# Patient Record
Sex: Male | Born: 1955 | Race: Black or African American | Hispanic: No | Smoking: Current some day smoker
Health system: Southern US, Community
[De-identification: ages and names within clinical notes are randomized; demographics above are authoritative.]

## PROBLEM LIST (undated history)

## (undated) DIAGNOSIS — I10 Essential (primary) hypertension: Secondary | ICD-10-CM

## (undated) DIAGNOSIS — E785 Hyperlipidemia, unspecified: Secondary | ICD-10-CM

## (undated) DIAGNOSIS — M25561 Pain in right knee: Secondary | ICD-10-CM

## (undated) DIAGNOSIS — M109 Gout, unspecified: Secondary | ICD-10-CM

## (undated) DIAGNOSIS — G4730 Sleep apnea, unspecified: Secondary | ICD-10-CM

## (undated) DIAGNOSIS — R7303 Prediabetes: Secondary | ICD-10-CM

## (undated) HISTORY — PX: SKIN GRAFT: SHX250

---

## 2013-12-03 ENCOUNTER — Encounter (HOSPITAL_COMMUNITY): Payer: Self-pay | Admitting: Emergency Medicine

## 2013-12-03 ENCOUNTER — Emergency Department (INDEPENDENT_AMBULATORY_CARE_PROVIDER_SITE_OTHER)
Admission: EM | Admit: 2013-12-03 | Discharge: 2013-12-03 | Disposition: A | Discharge status: Home / Self Care | Payer: Worker's Compensation | Source: Home / Self Care | Attending: Family Medicine | Admitting: Family Medicine

## 2013-12-03 DIAGNOSIS — S39011A Strain of muscle, fascia and tendon of abdomen, initial encounter: Secondary | ICD-10-CM

## 2013-12-03 DIAGNOSIS — Y990 Civilian activity done for income or pay: Secondary | ICD-10-CM

## 2013-12-03 DIAGNOSIS — T1490XA Injury, unspecified, initial encounter: Secondary | ICD-10-CM

## 2013-12-03 DIAGNOSIS — IMO0002 Reserved for concepts with insufficient information to code with codable children: Secondary | ICD-10-CM

## 2013-12-03 DIAGNOSIS — S3991XA Unspecified injury of abdomen, initial encounter: Secondary | ICD-10-CM

## 2013-12-03 DIAGNOSIS — W19XXXA Unspecified fall, initial encounter: Secondary | ICD-10-CM

## 2013-12-03 HISTORY — DX: Gout, unspecified: M10.9

## 2013-12-03 HISTORY — DX: Essential (primary) hypertension: I10

## 2013-12-03 MED ORDER — TRAMADOL HCL 50 MG PO TABS: 50.0000 mg | ORAL_TABLET | Freq: Four times a day (QID) | ORAL | Status: DC | PRN

## 2013-12-03 NOTE — ED Provider Notes (Signed)
Jonathan [name] is a 58 y.o. male who presents to Urgent Care today for right groin injury. Patient slipped and fell on ice today at work. He has pain in his right groin and oblique muscle. Pain is worse with sitting up. Patient denies any radiating pain weakness or numbness. He has not tried any medications yet. No fevers or chills.   Past Medical History  Diagnosis Date  . Gout   . Hypertension    History  Substance Use Topics  . Smoking status: Never Smoker   . Smokeless tobacco: Not on file  . Alcohol Use: Yes   ROS as above Medications: No current facility-administered medications for this encounter.   Current Outpatient Prescriptions  Medication Sig Dispense Refill  . allopurinol (ZYLOPRIM) 300 MG tablet Take 300 mg by mouth daily.      Marland Kitchen. amLODipine (NORVASC) 10 MG tablet Take 20 mg by mouth daily.      . benazepril (LOTENSIN) 40 MG tablet Take 40 mg by mouth daily.      . traMADol (ULTRAM) 50 MG tablet Take 1 tablet (50 mg total) by mouth every 6 (six) hours as needed.  15 tablet  0    Exam:  BP 156/93  Pulse 82  Temp(Src) 98.7 F (37.1 C) (Oral)  Resp 20  SpO2 96% Gen: Well NAD HEENT: EOMI,  MMM Lungs: Normal work of breathing. CTABL Heart: RRR no MRG Abd: NABS, Soft. Nondistended. Tender palpation right pubis with muscle flexion. Small reducible umbilical hernia present.  Right groin:  Non tender hip adductors.  Exts: Brisk capillary refill, warm and well perfused.  Right hip: Pain-free range of motion Genital: Normal uncircumcised penis. No inguinal hernia present. Testicles are descended bilaterally.    Assessment and Plan: 58 y.o. male with strain of oblique muscle. This is a result of the fall today at work. His injury is similar to a sports hernia.  Plan: Tramadol, rest, hernia support.  Follow up with sports medicine.    Discussed warning signs or symptoms. Please see discharge instructions. Patient expresses understanding.    Rodolph BongEvan S Lizet Kelso,  MD 12/03/13 2052

## 2013-12-03 NOTE — ED Notes (Signed)
Discussed WC plan of action

## 2013-12-03 NOTE — ED Notes (Signed)
States he did a split earlier, has pain in right groin area as a result

## 2013-12-03 NOTE — Discharge Instructions (Signed)
Thank you for coming in today. Take tramadol for severe pain as needed.  [facility].  [facility] and [facility] both have similar primary care sports medicine doctors.  Use an abdominal binder

## 2013-12-15 ENCOUNTER — Emergency Department (HOSPITAL_COMMUNITY)
Admission: EM | Admit: 2013-12-15 | Discharge: 2013-12-15 | Disposition: A | Discharge status: Home / Self Care | Payer: Worker's Compensation | Attending: Emergency Medicine | Admitting: Emergency Medicine

## 2013-12-15 ENCOUNTER — Emergency Department (HOSPITAL_COMMUNITY): Payer: Worker's Compensation

## 2013-12-15 ENCOUNTER — Encounter (HOSPITAL_COMMUNITY): Payer: Self-pay | Admitting: Emergency Medicine

## 2013-12-15 DIAGNOSIS — I10 Essential (primary) hypertension: Secondary | ICD-10-CM | POA: Insufficient documentation

## 2013-12-15 DIAGNOSIS — M25551 Pain in right hip: Secondary | ICD-10-CM

## 2013-12-15 DIAGNOSIS — G8911 Acute pain due to trauma: Secondary | ICD-10-CM | POA: Insufficient documentation

## 2013-12-15 DIAGNOSIS — M25569 Pain in unspecified knee: Secondary | ICD-10-CM | POA: Insufficient documentation

## 2013-12-15 DIAGNOSIS — W19XXXA Unspecified fall, initial encounter: Secondary | ICD-10-CM

## 2013-12-15 DIAGNOSIS — M25559 Pain in unspecified hip: Secondary | ICD-10-CM | POA: Insufficient documentation

## 2013-12-15 DIAGNOSIS — M25561 Pain in right knee: Secondary | ICD-10-CM

## 2013-12-15 DIAGNOSIS — Z79899 Other long term (current) drug therapy: Secondary | ICD-10-CM | POA: Insufficient documentation

## 2013-12-15 DIAGNOSIS — M109 Gout, unspecified: Secondary | ICD-10-CM | POA: Insufficient documentation

## 2013-12-15 MED ORDER — OXYCODONE-ACETAMINOPHEN 5-325 MG PO TABS: 1.0000 | ORAL_TABLET | Freq: Three times a day (TID) | ORAL | Status: DC | PRN

## 2013-12-15 MED ORDER — OXYCODONE-ACETAMINOPHEN 5-325 MG PO TABS
2.0000 | ORAL_TABLET | Freq: Once | ORAL | Status: AC
  Administered 2013-12-15: 2 via ORAL
  Filled 2013-12-15: qty 2

## 2013-12-15 NOTE — ED Notes (Signed)
Marissa,PA at the bedside.  

## 2013-12-15 NOTE — Progress Notes (Signed)
VASCULAR LAB PRELIMINARY  PRELIMINARY  PRELIMINARY  PRELIMINARY  Right lower extremity venous duplex completed.    Preliminary report:  Right:  No evidence of DVT, superficial thrombosis, or Baker's cyst.  Prestyn Mahn, RVS 12/15/2013, 9:13 AM

## 2013-12-15 NOTE — Discharge Instructions (Signed)
Please call your doctor for a followup appointment within 24-48 hours. When you talk to your doctor please let them know that you were seen in the emergency department and have them acquire all of your records so that they can discuss the findings with you and formulate a treatment plan to fully care for your new and ongoing problems. Please call and set-up an appointment with orthopedics - may need a MRI Please rest, ice, and elevate the leg Please avoid any physical or strenuous activity Please take pain medications as prescribed (percocets) - while on pain medications there is to be absolutely no drinking alcohol, no driving, no operating any heavy machinery. While on these medications please do not take any Tylenol for this can lead to Tylenol overdose or liver issues. Please do not take any other pain medication while this medication.  Please continueto use crutches and keep knee sleeve on when active Please continue to monitor symptoms closely and if symptoms are to worsen or change (fever greater than 101, chills, chest pain, shortness of breath, difficulty breathing, fall, injury, numbness, tingling, swelling to the knee, redness to the knee, warmth upon palpation, streaking up the leg, swelling to the leg) please report back to the ED immediately  Arthralgia Your caregiver has diagnosed you as suffering from an arthralgia. Arthralgia means there is pain in a joint. This can come from many reasons including:  Bruising the joint which causes soreness (inflammation) in the joint.  Wear and tear on the joints which occur as we grow older (osteoarthritis).  Overusing the joint.  Various forms of arthritis.  Infections of the joint. Regardless of the cause of pain in your joint, most of these different pains respond to anti-inflammatory drugs and rest. The exception to this is when a joint is infected, and these cases are treated with antibiotics, if it is a bacterial infection. HOME CARE  INSTRUCTIONS   Rest the injured area for as long as directed by your caregiver. Then slowly start using the joint as directed by your caregiver and as the pain allows. Crutches as directed may be useful if the ankles, knees or hips are involved. If the knee was splinted or casted, continue use and care as directed. If an stretchy or elastic wrapping bandage has been applied today, it should be removed and re-applied every 3 to 4 hours. It should not be applied tightly, but firmly enough to keep swelling down. Watch toes and feet for swelling, bluish discoloration, coldness, numbness or excessive pain. If any of these problems (symptoms) occur, remove the ace bandage and re-apply more loosely. If these symptoms persist, contact your caregiver or return to this location.  For the first 24 hours, keep the injured extremity elevated on pillows while lying down.  Apply ice for 15-20 minutes to the sore joint every couple hours while awake for the first half day. Then 03-04 times per day for the first 48 hours. Put the ice in a plastic bag and place a towel between the bag of ice and your skin.  Wear any splinting, casting, elastic bandage applications, or slings as instructed.  Only take over-the-counter or prescription medicines for pain, discomfort, or fever as directed by your caregiver. Do not use aspirin immediately after the injury unless instructed by your physician. Aspirin can cause increased bleeding and bruising of the tissues.  If you were given crutches, continue to use them as instructed and do not resume weight bearing on the sore joint until instructed. Persistent  pain and inability to use the sore joint as directed for more than 2 to 3 days are warning signs indicating that you should see a caregiver for a follow-up visit as soon as possible. Initially, a hairline fracture (break in bone) may not be evident on X-rays. Persistent pain and swelling indicate that further evaluation, non-weight  bearing or use of the joint (use of crutches or slings as instructed), or further X-rays are indicated. X-rays may sometimes not show a small fracture until a week or 10 days later. Make a follow-up appointment with your own caregiver or one to whom we have referred you. A radiologist (specialist in reading X-rays) may read your X-rays. Make sure you know how you are to obtain your X-ray results. Do not assume everything is normal if you do not hear from [place]. SEEK MEDICAL CARE IF: Bruising, swelling, or pain increases. SEEK IMMEDIATE MEDICAL CARE IF:   Your fingers or toes are numb or blue.  The pain is not responding to medications and continues to stay the same or get worse.  The pain in your joint becomes severe.  You develop a fever over 102 F (38.9 C).  It becomes impossible to move or use the joint. MAKE SURE YOU:   Understand these instructions.  Will watch your condition.  Will get help right away if you are not doing well or get worse. Document Released: 09/30/2005 Document Revised: 12/23/2011 Document Reviewed: 05/18/2008 [facility] Patient Information 2014 [place], [place].   Emergency Department Resource Guide 1) Find a Doctor and Pay Out of Pocket Although you won't have to find out who is covered by your insurance plan, it is a good idea to ask around and get recommendations. You will then need to call the office and see if the doctor you have chosen will accept you as a new patient and what types of options they offer for patients who are self-pay. Some doctors offer discounts or will set up payment plans for their patients who do not have insurance, but you will need to ask so you aren't surprised when you get to your appointment.  2) Contact Your Local Health Department Not all health departments have doctors that can see patients for sick visits, but many do, so it is worth a call to see if yours does. If you don't know where your local health department is, you can  check in your phone book. The CDC also has a tool to help you locate your state's health department, and many state websites also have listings of all of their local health departments.  3) Find a Walk-in Clinic If your illness is not likely to be very severe or complicated, you may want to try a walk in clinic. These are popping up all over the country in pharmacies, drugstores, and shopping centers. They're usually staffed by nurse practitioners or physician assistants that have been trained to treat common illnesses and complaints. They're usually fairly quick and inexpensive. However, if you have serious medical issues or chronic medical problems, these are probably not your best option.  No Primary Care Doctor: - Call Health Connect at  [phone] - they can help you locate a primary care doctor that  accepts your insurance, provides certain services, etc. - Physician Referral Service- [phone]  Chronic Pain Problems: Organization         Address  Phone   Notes  [facility]  [phone] Patients need to be referred by their primary care  doctor.   Medication Assistance: Organization         Address  Phone   Notes  [facility] Medication [facility] [address] [place] [place], [place] [address] [phone] --Must be a resident of [facility] -- Must have NO insurance coverage whatsoever (no Medicaid/ Medicare, etc.) -- The pt. MUST have a primary care doctor that directs their care regularly and follows them in the community   MedAssist  [phone]   [employer]  [phone]    Agencies that provide inexpensive medical care: Organization         Address  Phone   Notes  [facility]  [phone]   [facility]    [phone]   [facility] [address] [place], [place] [address] [phone]   [facility] [address] [place], [place] [phone]    Planned Parenthood    [phone]   [facility]    [phone]   [facility]  [address], [place] Phone:  [phone], Fax:  [phone] Hours of Operation:  9 am - 6 pm, M-F.  Also accepts Medicaid/Medicare and self-pay.  [facility]  [address], [place] Phone: [phone], Fax: [phone]. Hours of Operation:  8:30 am - 5:30 pm, M-F.  Also accepts Medicaid and self-pay.  [facility] [place] [address] [place] Phone: [phone]   [facility] [address] Natasha Bence [place], [place] [phone], Ext. [phone] Mondays & Thursdays: 7-9 AM.  First 15 patients are seen on a first come, first serve basis.    Medicaid-accepting [facility] Providers:  Organization         Address  Phone   Notes  [facility] [address], [place] [phone] Also accepts self-pay patients.  [facility] [address] Laurell Josephs [place], [place]  [phone]   [facility] [address], [place] [phone]   [facility] [address], [place] [phone]   Renaye Rakers [address], [place]   [phone] Only accepts [place] patients after they have their name applied to their card.   Self-Pay (no insurance) in [facility]:  Organization         Address  Phone   Notes  Sickle Cell Patients, [facility] Internal Medicine [address] [place], [place] [phone]   [facility] [address] [place], [place] [phone]   [facility] [place]  [address], [place] [phone]   [facility]/Dr. Osei-Bonsu  [address], [place] or [address], [place] [phone] Phone number for both [place] and [place] locations is the  same.  [facility]gent Medical [facility] [address] [place] [phone]   [facility] [address] [place] or [address] [phone]   [facility] [address] [place] [phone], phone; [phone], fax Sees patients 1st and 3rd Saturday of every month.  Must not qualify for public or private insurance (i.e. Medicaid, Medicare,  Health Choice, Veterans' Benefits)  Household income should be no more than 200% of the  poverty level The clinic cannot treat you if you are pregnant or think you are pregnant  Sexually transmitted diseases are not treated at the clinic.    Dental Care: Organization         Address  Phone  Notes  [facility] [address] [place], [place] [phone] Accepts children up to age 37 who are enrolled in [place] or [place] Health Choice; pregnant women with a Medicaid card; and children who have applied for Medicaid or [place] Health Choice, but were declined, whose parents can pay a reduced fee at time of service.  [facility] Department of [facility]  [address], [place] [phone] Accepts children up to age 61 who are enrolled in [place] or [place] Health Choice; pregnant women with a Medicaid card; and children who have applied for Medicaid or [place] Health Choice, but were declined, whose parents can pay a reduced fee at time of service.  [facility]  [address] [place], [place] [phone] Patients are seen by appointment only. Walk-ins are not accepted. [facility] will see patients 88 years of age and older. Monday - Tuesday (8am-5pm) Most Wednesdays (8:30-5pm) $30 per visit, cash only  [facility] Adult [facility]  [address], [facility] [phone] Patients are seen by appointment only. Walk-ins are not accepted. [facility] will see patients  31 years of age and older. One Wednesday Evening (Monthly: Volunteer Based).  $30 per visit, cash only  [employer]  [phone] for adults; Children under age 33, call Graduate Pediatric Dentistry at [phone]. Children aged 73-14, please call [phone] to request a pediatric application.  Dental services are provided in all areas of dental care including fillings, crowns and bridges, complete and partial dentures, implants, gum treatment, root canals, and extractions. Preventive care is also provided. Treatment is provided to both adults and children. Patients are selected via a lottery and there is often a waiting list.   [facility] [address] [place]  [phone] [email or web address]   [facility] [address] [place], [place] [phone], Ext. [phone] Second and Fourth Thursday of each month, opens at 6:30 AM; Clinic ends at 9 AM.  Patients are seen on a first-come first-served basis, and a limited number are seen during each clinic.   [facility]  [address] Ether Griffins [place], [place] [phone]   Eligibility Requirements You must have lived in [place], [place], or [place] counties for at least the last three months.   You cannot be eligible for state or federal sponsored [place], including [employer], [place], or [employer].   You generally cannot be eligible for healthcare insurance through your employer.    How to apply: Eligibility screenings are held every Tuesday and Wednesday afternoon from 1:00 pm until 4:00 pm. You do not need an appointment for the interview!  [facility] [address], [place], [place] [phone]   [facility] Health Department  [phone]   [facility] Health Department  [phone]   [facility] Health Department  [phone]    Behavioral Health Resources in the Community: Intensive Outpatient  Programs Organization         Address  Phone  Notes  [facility] [address], [place], [place] [phone]   [facility] [address]  [address], [place], [place] [phone]   [facility] [address], [place], [place]  [phone]   [facility] [address],  [place], [place] [phone] or [phone]   Substance Abuse Resources Organization         Address  Phone  Notes  [facility]  [phone]   [facility]  [phone]   [facility]  [phone]   [facility]  [phone]   Residential & Outpatient Substance Abuse Program  [phone]   Psychological Services Organization         Address  Phone  Notes  [facility]  [phone]- [phone]   [facility]  [phone]- [phone]   [facility] [address], [place] [phone] or [phone]    Mobile Crisis Teams Organization         Address  Phone  Notes  [facility], Mobile Crisis Care Unit  [phone]   [facility]  [address] [place], [place] [phone]   Doristine LocksSharon DeEsch [address] [place] [phone]    Self-Help/Support Groups Organization         Address  Phone             Notes  [facility]oc[facility] of [place] - variety of support groups  336- [phone] Call for more information  Narcotics Anonymous (NA), [facility] [address], [place]  2 meetings at this location   Statisticianesidential Treatment Programs Organization         Address  Phone  Notes  [facility] [address],    [place]  [phone]   [facility]  [address], [place] [phone], [place], [place] [phone]   [facility] [address], [place] [phone] Admissions: 8am-3pm M-F  [facility] [address]    [place], [place]  [phone]   [facility] [address], [place], [place] [phone]   [facility] [address]  [place], [place] [phone]   [facility] - Intensive Outpatient [address], [place], [place] [phone]   [facility] ([facility] [address]  [place], [place] [phone] or [phone]   [facility] ([facility]) [address] [place], [place] [phone] Accepts Medicaid  [facility] [address]  [place] [phone] Substance Abuse/Addiction Treatment   [facility] Resources Organization         Address  Phone  Notes  [facility]  [phone]) [phone]   Angie FavaJulie Brannon, PhD [address], [address] [place], [place]   [phone](336) [phone] or [phone]   [facility]   [address] [place], [place] [phone](336) [phone]   [facility] [address], [place], [place] [phone] Insurance/Medicaid/sponsorship through [facility]  [facility] Families [address]                                    [place], [place] [phone] Therapy/tele-psych/case  [facility] [address]   [place], [place] [phone]    Dr. Lolly MustacheArfeen  [phone]   [facility]  [employer] [facility] 1) [address], [place] 2) [address], [place] 3)  [address], [place] [phone]  [phone]  [phone]   [place] [phone] or [phone] (After Hours)

## 2013-12-15 NOTE — ED Notes (Signed)
Pt here for c/o RLE pain with walking. Pt states he fell two weeks ago and was told he pulled a muscle from his R hip to leg. Pt complain pain 8/10

## 2013-12-15 NOTE — ED Provider Notes (Signed)
CSN: [id number]     Arrival date & time 12/15/13  0622 History   First MD Initiated Contact with Patient 12/15/13 [phone]     Chief Complaint  Patient presents with  . Fall  . right knee and hip pain      (Consider location/radiation/quality/duration/timing/severity/associated sxs/prior Treatment) The history is provided by the patient. No language interpreter was used.  Jonathan [name] is a 58 year old male with past medical history of gout and hypertension presenting to the ED with right hip and right knee pain that has been ongoing for the past 2 weeks after he fell on ice. Stated that he was seen and assessed in urgent care approximately 2 weeks ago where he was discharged with pain medications-stated that he has not filled these pain medications. Reported that he was actually seen by his primary care provider yesterday he was given medications for gout. Stated he's been taking colchicine and indomethacin as prescribed. Reported that the right knee pain as a constant throbbing sensation described as a "tooth ache" with right hip pain described as a tingling sensation that is constant. Denied new injury, fever, chills, shortness of breath, difficulty breathing, chest pain, numbness, tingling, weakness. PCP family [facility] in [place]  Past Medical History  Diagnosis Date  . Gout   . Hypertension    History reviewed. No pertinent past surgical history. History reviewed. No pertinent family history. History  Substance Use Topics  . Smoking status: Never Smoker   . Smokeless tobacco: Not on file  . Alcohol Use: Yes    Review of Systems  Constitutional: Negative for fever and chills.  Respiratory: Negative for chest tightness and shortness of breath.   Cardiovascular: Negative for chest pain.  Musculoskeletal: Positive for arthralgias (Right knee and hip pain).  Neurological: Negative for weakness and numbness.  All other systems reviewed and are negative.      Allergies  Review  of patient's allergies indicates no known allergies.  Home Medications   Current Outpatient Rx  Name  Route  Sig  Dispense  Refill  . allopurinol (ZYLOPRIM) 300 MG tablet   Oral   Take 300 mg by mouth daily.         Marland Kitchen. amLODipine (NORVASC) 10 MG tablet   Oral   Take 20 mg by mouth daily.         . benazepril (LOTENSIN) 20 MG tablet   Oral   Take 20 mg by mouth daily.         . colchicine 0.6 MG tablet   Oral   Take 0.6 mg by mouth daily.         . indomethacin (INDOCIN) 50 MG capsule   Oral   Take 50 mg by mouth 3 (three) times daily with meals.         . traMADol (ULTRAM) 50 MG tablet   Oral   Take 1 tablet (50 mg total) by mouth every 6 (six) hours as needed.   15 tablet   0   . oxyCODONE-acetaminophen (PERCOCET/ROXICET) 5-325 MG per tablet   Oral   Take 1 tablet by mouth every 8 (eight) hours as needed for severe pain.   7 tablet   0    BP 136/77  Pulse 79  Temp(Src) 97.7 F (36.5 C) (Oral)  Resp 18  Ht 5\' 11"  (1.803 m)  Wt 310 lb (140.615 kg)  BMI 43.26 kg/m2  SpO2 94% Physical Exam  Nursing note and vitals reviewed. Constitutional: He is oriented to  person, place, and time. He appears well-developed and well-nourished. No distress.  HENT:  Head: Normocephalic and atraumatic.  Mouth/Throat: Oropharynx is clear and moist. No oropharyngeal exudate.  Eyes: Conjunctivae and EOM are normal. Pupils are equal, round, and reactive to light. Right eye exhibits no discharge.  Neck: Normal range of motion. Neck supple.  Cardiovascular: Normal rate, regular rhythm and normal heart sounds.   Mild swelling localized to right lower extremity negative pitting edema-negative discoloration or ulcers identified  Pulmonary/Chest: Effort normal and breath sounds normal. No respiratory distress. He has no wheezes. He has no rales.  Musculoskeletal: He exhibits tenderness.       Legs: Swelling localized to the right knee-circumferentially with negative erythema,  warmth upon palpation, inflammation. Discomfort upon palpation circumferentially to the right knee. Positive calf tenderness. Most discomfort upon palpation to the posterior aspect of the right knee. Discomfort upon palpation to the right inguinal region and right acetabulum region of the hip. Decreased range of motion to the right knee and decreased abduction to the right hip secondary to pain. Full range of motion to right ankle and digits of the right foot without difficulty.  Neurological: He is alert and oriented to person, place, and time. No cranial nerve deficit. He exhibits normal muscle tone. Coordination normal.  Cranial nerves III-XII grossly intact Strength 5+/5+ to upper and lower extremities bilaterally with resistance applied, equal distribution noted Sensation intact with differentiation sharp and dull touch Strength intact to digits of right foot  Skin: Skin is warm and dry. No rash noted. He is not diaphoretic. No erythema.  Psychiatric: He has a normal mood and affect. His behavior is normal. Thought content normal.    ED Course  Procedures (including critical care time)  Dg Hip Bilateral W/pelvis  12/15/2013   CLINICAL DATA:  Fall 2 weeks ago.  Right hip and knee pain.  EXAM: BILATERAL HIP WITH PELVIS - 4+ VIEW  COMPARISON:  None.  FINDINGS: There is no evidence of acute fracture or dislocation. No significant arthropathic changes are identified involving either hip joint. The pelvis appears intact. Sacroiliac joints are unremarkable. No soft tissue abnormality is seen.  IMPRESSION: Negative.   Electronically Signed   By: Sebastian Ache   On: 12/15/2013 08:55   Dg Knee Complete 4 Views Right  12/15/2013   CLINICAL DATA:  Fall 2 weeks ago, injury, right knee swelling  EXAM: RIGHT KNEE - COMPLETE 4+ VIEW  COMPARISON:  None.  FINDINGS: Four views of the right knee submitted. No acute fracture or subluxation. Moderate joint effusion. Spurring of patella. Narrowing of patellofemoral  joint space. Mild spurring of medial femoral condyle. Mild soft tissue swelling medially.  IMPRESSION: No acute fracture or subluxation. Moderate joint effusion. Mild degenerative changes. Medial soft tissue swelling.   Electronically Signed   By: Natasha Mead M.D.   On: 12/15/2013 08:52   Labs Review Labs Reviewed - No data to display Imaging Review Dg Hip Bilateral W/pelvis  12/15/2013   CLINICAL DATA:  Fall 2 weeks ago.  Right hip and knee pain.  EXAM: BILATERAL HIP WITH PELVIS - 4+ VIEW  COMPARISON:  None.  FINDINGS: There is no evidence of acute fracture or dislocation. No significant arthropathic changes are identified involving either hip joint. The pelvis appears intact. Sacroiliac joints are unremarkable. No soft tissue abnormality is seen.  IMPRESSION: Negative.   Electronically Signed   By: Sebastian Ache   On: 12/15/2013 08:55   Dg Knee Complete 4 Views Right  12/15/2013   CLINICAL DATA:  Fall 2 weeks ago, injury, right knee swelling  EXAM: RIGHT KNEE - COMPLETE 4+ VIEW  COMPARISON:  None.  FINDINGS: Four views of the right knee submitted. No acute fracture or subluxation. Moderate joint effusion. Spurring of patella. Narrowing of patellofemoral joint space. Mild spurring of medial femoral condyle. Mild soft tissue swelling medially.  IMPRESSION: No acute fracture or subluxation. Moderate joint effusion. Mild degenerative changes. Medial soft tissue swelling.   Electronically Signed   By: Natasha Mead M.D.   On: 12/15/2013 08:52     EKG Interpretation None      MDM   Final diagnoses:  Right knee pain  Right hip pain  Fall   Medications  oxyCODONE-acetaminophen (PERCOCET/ROXICET) 5-325 MG per tablet 2 tablet (2 tablets Oral Given 12/15/13 0814)   Filed Vitals:   12/15/13 0624 12/15/13 1020  BP: 151/88 136/77  Pulse: 82 79  Temp: 97.7 F (36.5 C)   TempSrc: Oral   Resp: 18   Height: 5\' 11"  (1.803 m)   Weight: 310 lb (140.615 kg)   SpO2: 94% 94%    Patient presenting to the ED  with right knee and hip pain that has been ongoing for the past 2 weeks after a fall that occurred 2 weeks ago while on ice. Patient reported that the pain is localized in the right hip described as a tingling sensation and most discomfort to the right knee described as a throbbing, constant "toothache." Stated that he was seen and assessed by urgent care Center approximately 2 weeks ago where pain medications were prescribed, stated that he has not filled these prescriptions. Reported that he has been seen by his primary care provider yesterday who did prescribe him gout medications - colchicine and indomethacin. Alert and oriented. GCS 15. Heart rate and rhythm normal. Lungs clear to auscultation to upper and lower lobes bilaterally. Radial and DP pulses 2+ bilaterally. Swelling localized to the right knee circumferentially with negative erythema, inflammation, warmth upon palpation. Decreased range of motion noted to the right knee secondary to pain. Most discomfort upon palpation to the posterior aspect of the right knee. Discomfort upon palpation to the right hip to acetabulum region with decreased range of motion secondary to pain. Mild swelling localized to the lower right extremity with negative pitting edema identified. Negative changes of discoloration or ulcers.  Plain film of bilateral hip with pelvis negative for acute osseous injury. Right knee noted moderate joint effusion with medial soft tissue swelling-no acute fracture subluxation noted-negative acute osseous injury. Doppler of right lower extremity negative for DVT, clots, Baker's cyst. Doubt septic joint. Negative findings of DVT or Baker's cyst. Suspicion to be gout with mild injury to the right knee secondary to fall. Patient placed in knee sleeve for comfort and crutches for comfort. Referred patient to orthopedics and primary care provider. Reported may need outpatient MRI to be performed. Discussed with patient to rest, ice, elevate.  Discharge patient with small dose of pain medications - discussed course, precautions, disposal technique - discussed with patient that if he takes these medications he needs to discontinue his other pain medications, cannot mix. Discussed with patient to avoid any physical strenuous activity. Discussed with patient to closely monitor symptoms and if symptoms are to worsen or change to report back to the ED - strict return instructions given.  Patient agreed to plan of care, understood, all questions answered.   Raymon Mutton, PA-C 12/15/13 2106

## 2013-12-15 NOTE — Progress Notes (Signed)
Orthopedic Tech Progress Note Patient Details:  Jonathan [name] 09-17-1956 [id number]  Ortho Devices Type of Ortho Device: Knee Sleeve Ortho Device/Splint Interventions: Application   Jonathan [name], Jonathan [name] 12/15/2013, 10:53 AM

## 2013-12-15 NOTE — ED Notes (Signed)
Approx 2 weeks ago fell on ice and injured his right knee and hip  Was seen at Urgent care and instructed to rest but did not and returned to work as a [employer]

## 2013-12-18 NOTE — ED Provider Notes (Signed)
Medical screening examination/treatment/procedure(s) were performed by non-physician practitioner and as supervising physician I was immediately available for consultation/collaboration.   EKG Interpretation None        Analeese Andreatta S Jimmylee Ratterree, MD 12/18/13 0751 

## 2015-04-03 IMAGING — CR DG KNEE COMPLETE 4+V*R*
4 series · 4 of 4 positions shown · non-contrast
Comparison: None.

CLINICAL DATA: Fall 2 weeks ago, injury, right knee swelling

EXAM:
RIGHT KNEE - COMPLETE 4+ VIEW

[t knee ap right]
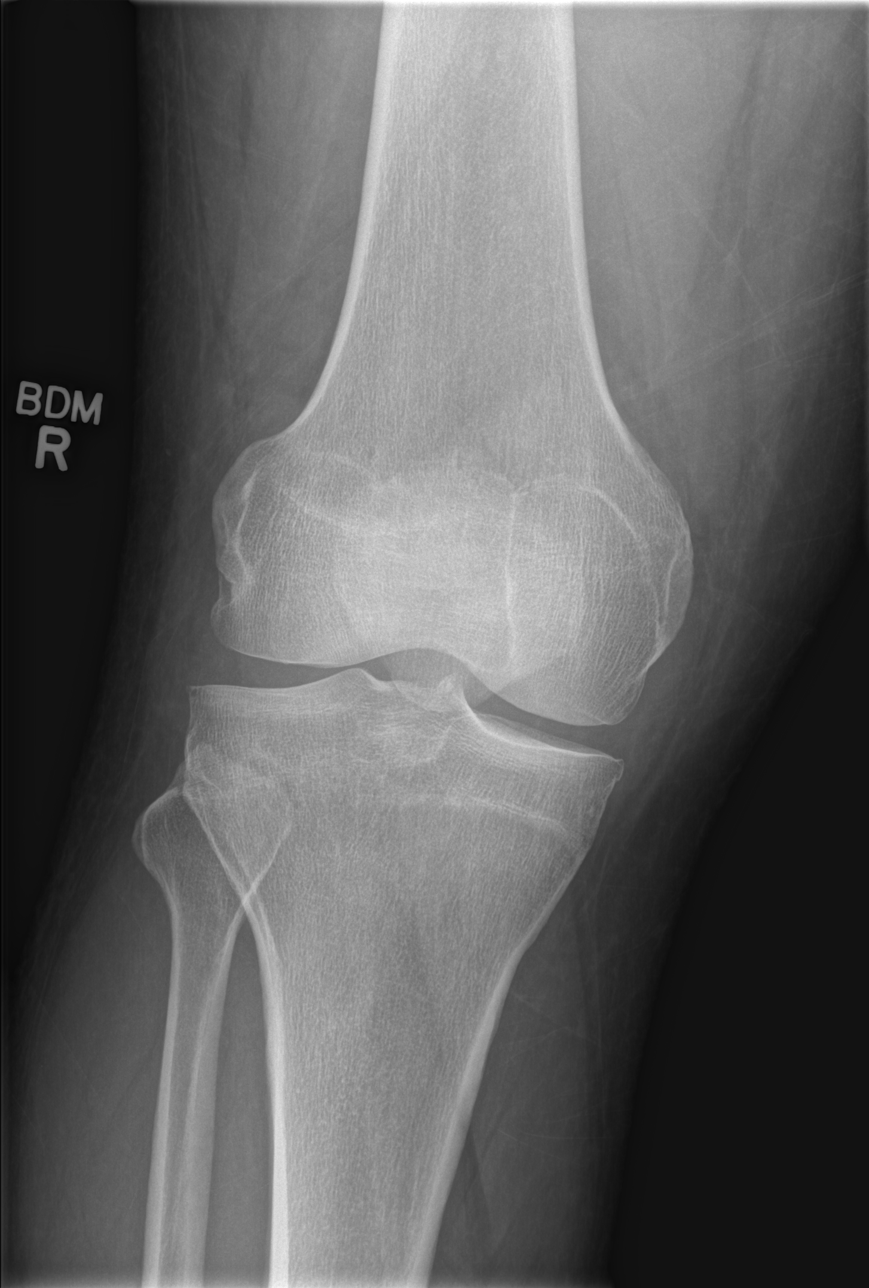

[t knee obl right (1 of 2)]
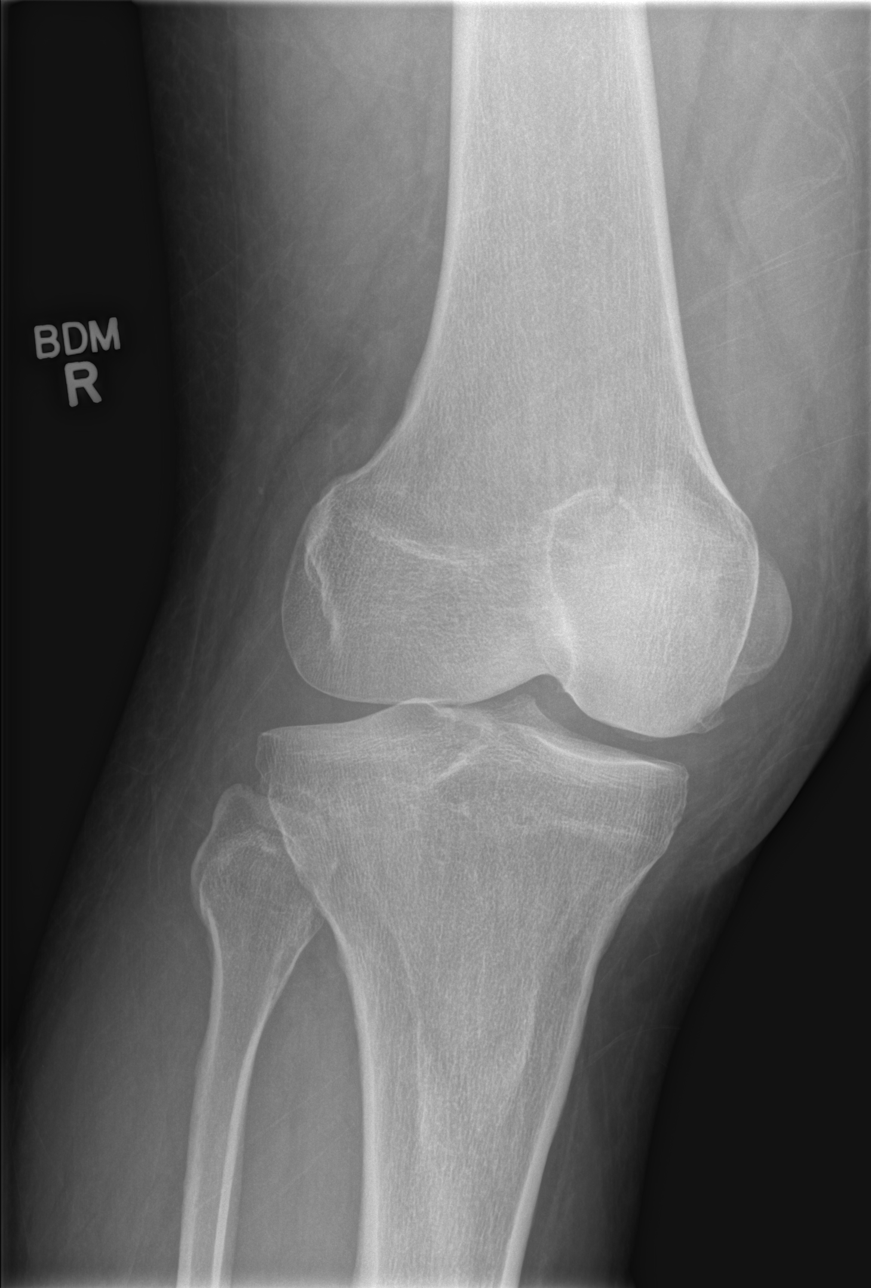

[t knee obl right (2 of 2)]
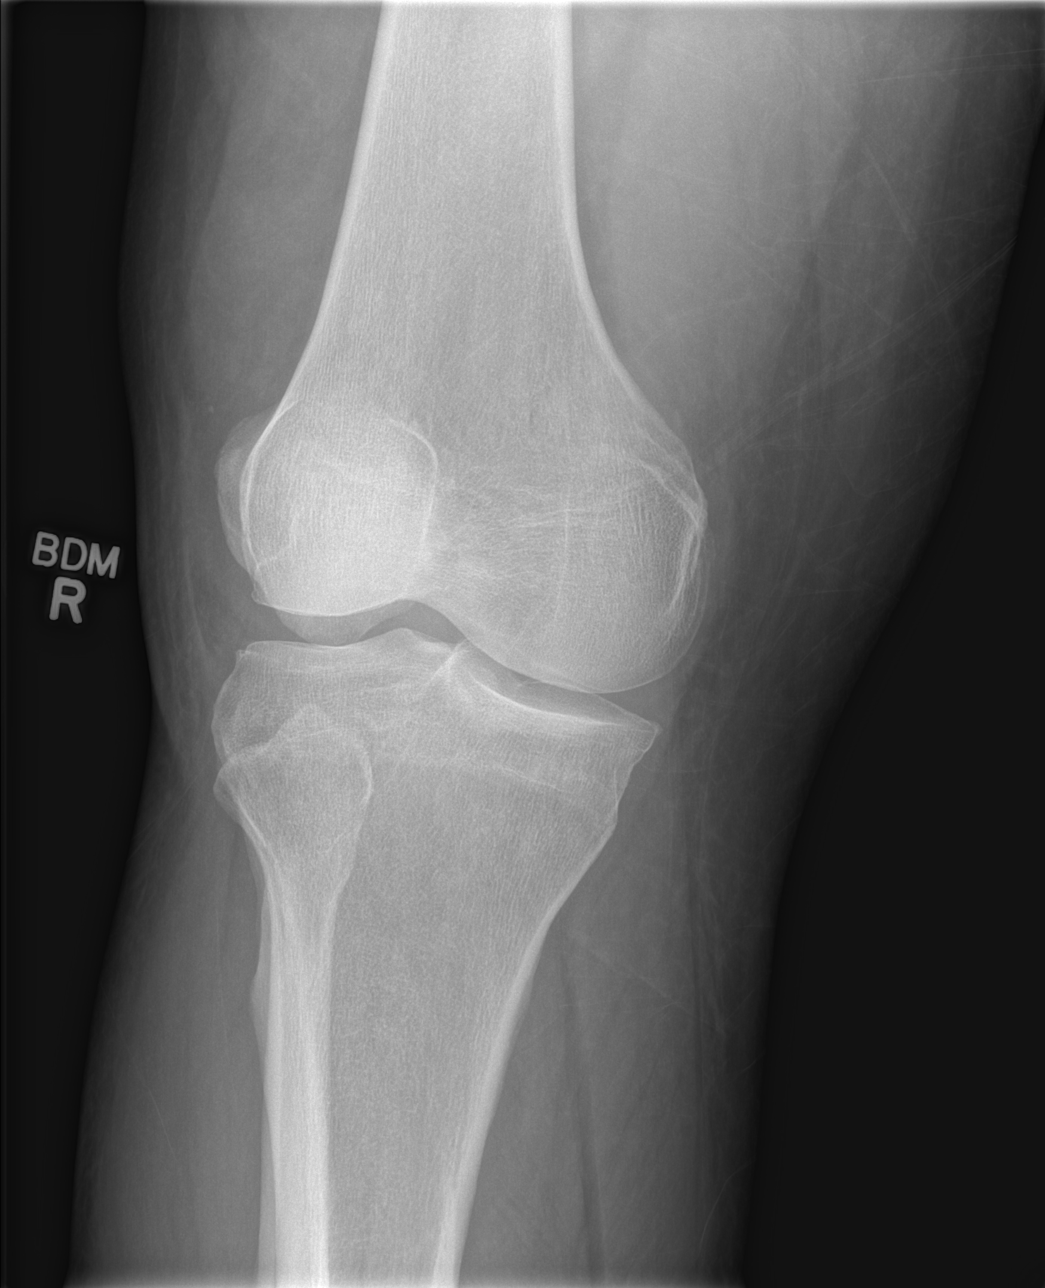

[x knee lat right]
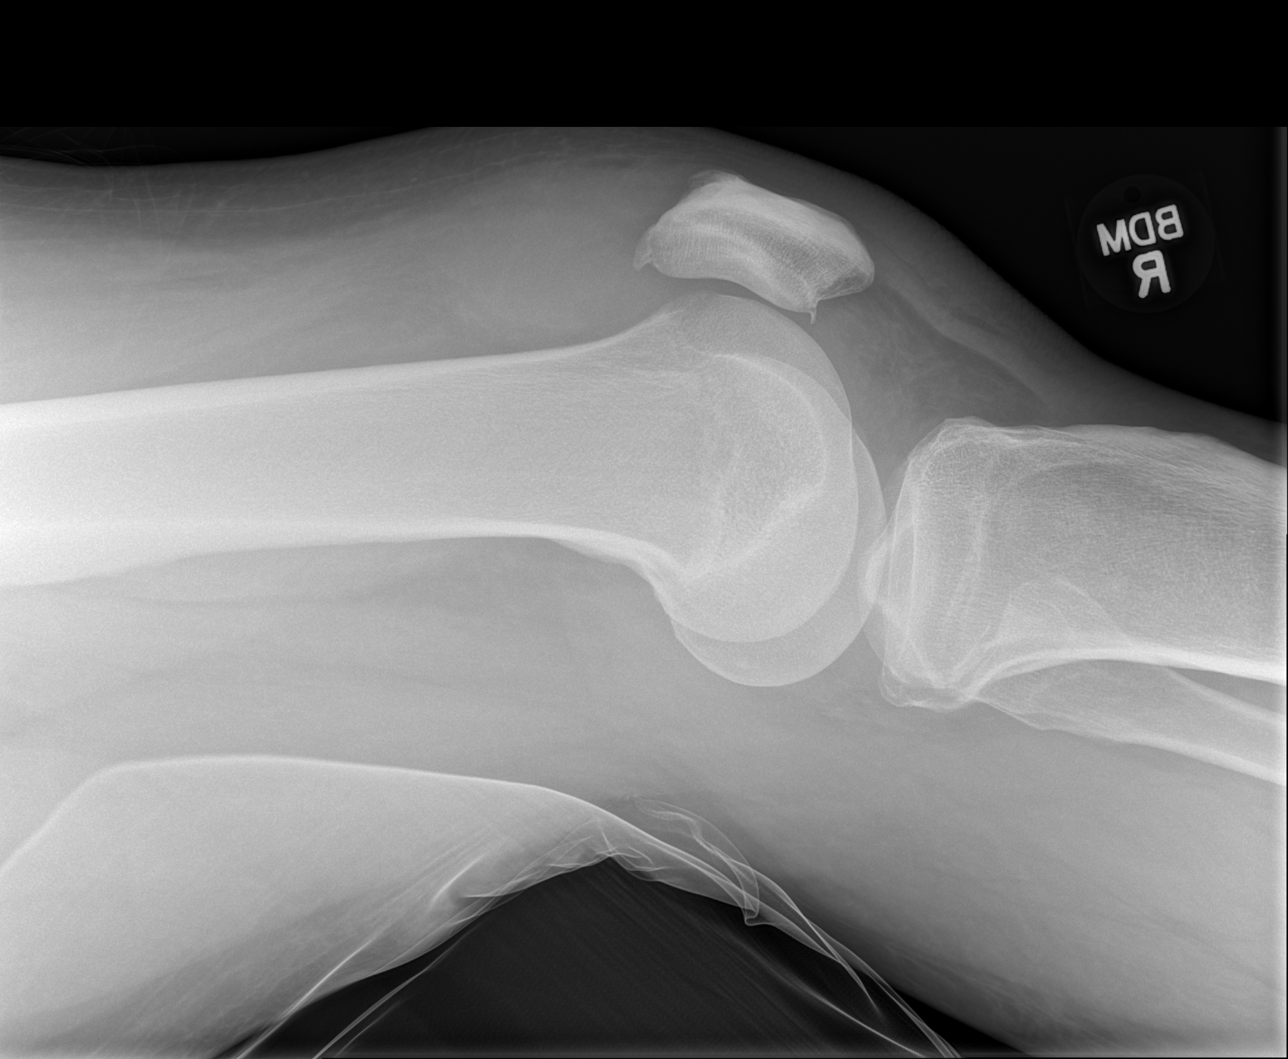

[4 of 4 positions shown; findings below may reference images not displayed]

FINDINGS: Four views of the right knee submitted. No acute fracture or
subluxation. Moderate joint effusion. Spurring of patella. Narrowing
of patellofemoral joint space. Mild spurring of medial femoral
condyle. Mild soft tissue swelling medially.
IMPRESSION: No acute fracture or subluxation. Moderate joint effusion. Mild
degenerative changes. Medial soft tissue swelling.

## 2016-10-28 ENCOUNTER — Ambulatory Visit
Admission: EM | Admit: 2016-10-28 | Discharge: 2016-10-28 | Disposition: A | Discharge status: Home / Self Care | Payer: PRIVATE HEALTH INSURANCE | Attending: Family Medicine | Admitting: Family Medicine

## 2016-10-28 DIAGNOSIS — M109 Gout, unspecified: Secondary | ICD-10-CM

## 2016-10-28 DIAGNOSIS — M79672 Pain in left foot: Secondary | ICD-10-CM

## 2016-10-28 MED ORDER — COLCHICINE 0.6 MG PO TABS: 0.6000 mg | ORAL_TABLET | Freq: Every day | ORAL | 0 refills | Status: DC

## 2016-10-28 MED ORDER — PREDNISONE 10 MG PO TABS: ORAL_TABLET | ORAL | 0 refills | Status: DC

## 2016-10-28 MED ORDER — HYDROCODONE-ACETAMINOPHEN 5-325 MG PO TABS: 1.0000 | ORAL_TABLET | Freq: Three times a day (TID) | ORAL | 0 refills | Status: DC | PRN

## 2016-10-28 NOTE — Discharge Instructions (Signed)
Take medication as prescribed. Rest. Drink plenty of fluids. Elevate.   Follow up with your primary care physician this week. Return to Urgent care for new or worsening concerns.

## 2016-10-28 NOTE — ED Provider Notes (Signed)
[facility] ____________________________________________  Time seen: Approximately 2:29 PM  I have reviewed the triage vital signs and the nursing notes.   HISTORY  Chief Complaint Foot Pain (left)   HPI Jonathan [name] is a 61 y.o. male presenting complaint of left foot pain for the last for 5 days. Patient reports he just woke up with the pain. Patient reports pain gradually worsened. Patient reports pain presentation is consistent with his past gout flareups. Patient reports that pain initially started in his ankle, then side of his foot and then into his great toe. Patient reports associated warmth to touch as well as swelling. Patient reports pain with trying to weight-bear. Patient reports that previously he was on daily allopurinol, not currently. Patient reports he has not yet taken his daily blood pressure medication yet but has at home and will take   it once home.States pain unresolved with over the counter advil.   Patient reports moderate pain at this time. Pain worse with weightbearing. Reports has continued to be able to weight-bear. Denies any paresthesias, pain radiation, drainage, break in skin or other complaints. Denies fall, injury or trauma. Patient reports pain is again consistent with previous gout. Patient reports that he tolerates colchicine well as well as prednisone. States last flare up was approximately 3-4 months ago. Denies known triggers.     Past Medical History:  Diagnosis Date  . Gout   . Hypertension     There are no active problems to display for this patient.   Past Surgical History:  Procedure Laterality Date  . SKIN GRAFT      No current facility-administered medications for this encounter.   Current Outpatient Prescriptions:  .  amLODipine (NORVASC) 10 MG tablet, Take 20 mg by mouth daily., Disp: , Rfl:  .  benazepril (LOTENSIN) 20 MG tablet, Take 20 mg by mouth daily., Disp: , Rfl:  .  allopurinol (ZYLOPRIM) 300 MG  tablet, Take 300 mg by mouth daily., Disp: , Rfl:  .  colchicine 0.6 MG tablet, Take 1 tablet (0.6 mg total) by mouth daily. Take 1.2 my orally once, then one hour later take 0.6 mg orally., Disp: 3 tablet, Rfl: 0 .  HYDROcodone-acetaminophen (NORCO/VICODIN) 5-325 MG tablet, Take 1 tablet by mouth every 8 (eight) hours as needed for moderate pain or severe pain (do not drive or operate machinery while taking as can cause drowsiness)., Disp: 8 tablet, Rfl: 0 .  predniSONE (DELTASONE) 10 MG tablet, Start 60 mg po day one, then 50 mg po day two, taper by 10 mg daily until complete., Disp: 21 tablet, Rfl: 0  Allergies Patient has no known allergies.  History reviewed. No pertinent family history.  Social History Social History  Substance Use Topics  . Smoking status: Never Smoker  . Smokeless tobacco: Never Used  . Alcohol use Yes    Review of Systems Constitutional: No fever/chills Eyes: No visual changes. ENT: No sore throat. Cardiovascular: Denies chest pain. Respiratory: Denies shortness of breath. Gastrointestinal: No abdominal pain.  No nausea, no vomiting.  No diarrhea.  No constipation. Genitourinary: Negative for dysuria. Musculoskeletal: Negative for back pain. As above.  Skin: Negative for rash. Neurological: Negative for headaches, focal weakness or numbness.  10-point ROS otherwise negative.  ____________________________________________   PHYSICAL EXAM:  VITAL SIGNS: ED Triage Vitals  Enc Vitals Group     BP 10/28/16 1308 (!) 168/92     Pulse Rate 10/28/16 1308 81     Resp 10/28/16 1308 17  Temp 10/28/16 1308 99.1 F (37.3 C)     Temp Source 10/28/16 1308 Oral     SpO2 10/28/16 1308 97 %     Weight --      Height 10/28/16 1305 6' (1.829 m)     Head Circumference --      Peak Flow --      Pain Score 10/28/16 1308 10     Pain Loc --      Pain Edu? --      Excl. in GC? --     Constitutional: Alert and oriented. Well appearing and in no acute  distress. Eyes: Conjunctivae are normal. PERRL. EOMI. ENT      Head: Normocephalic and atraumatic. Cardiovascular: Normal rate, regular rhythm. Grossly normal heart sounds.  Good peripheral circulation. Respiratory: Normal respiratory effort without tachypnea nor retractions. Breath sounds are clear and equal bilaterally. No wheezes/rales/rhonchi.. Musculoskeletal:No midline cervical, thoracic or lumbar tenderness to palpation. Bilateral pedal pulses equal and easily palpated. Except: Left foot with diffuse tenderness and mild swelling, minimal erythema, no break in skin, no drainage, no induration or fluctuance, tenderness increases at base of great toe as well as anterior ankle, full range of motion present, normal distal sensation and capillary refill, left lower extremity otherwise nontender. Neurologic:  Normal speech and language. Marland Kitchen Speech is normal. No gait instability.  Skin:  Skin is warm, dry and intact. No rash noted. Psychiatric: Mood and affect are normal. Speech and behavior are normal. Patient exhibits appropriate insight and judgment   ___________________________________________   LABS (all labs ordered are listed, but only abnormal results are displayed)  Labs Reviewed - No data to display ____________________________________________   PROCEDURES Procedures     INITIAL IMPRESSION / ASSESSMENT AND PLAN / ED COURSE  Pertinent labs & imaging results that were available during my care of the patient were reviewed by me and considered in my medical decision making (see chart for details).  Well-appearing patient. No acute distress. Presents for the complaints of left foot pain. Patient reports consistent with previous gout flares. Patient denies injury or trauma. Patient declines x-ray of left foot or ankle. Denies recent flareup. Patient states that in past colchicine worked well for him, however also reports sometimes has had prednisone. Will use colchicine initial dose  1.2mg ,  followed by 0.6mg  an hour later. Discussed with patient if this does not resolve, start prednisone prescription tomorrow. When necessary Vicodin as needed for breakthrough pain. [place] controlled substance database reviewed, and no controlled prescriptions noted in last 6 months for patient.Discussed indication, risks and benefits of medications with patient. Also counseled regarding taking daily medications as prescribed including blood pressure and follow with PCP, as blood pressure noted to be elevated today, patient states will take blood pressure medication once home and follow up.Discussed indication, risks and benefits of medications with patient.  Discussed follow up with Primary care physician this week. Discussed follow up and return parameters including no resolution or any worsening concerns. Patient verbalized understanding and agreed to plan.   ____________________________________________   FINAL CLINICAL IMPRESSION(S) / ED DIAGNOSES  Final diagnoses:  Left foot pain  Acute gout of left foot, unspecified cause     New Prescriptions   COLCHICINE 0.6 MG TABLET    Take 1 tablet (0.6 mg total) by mouth daily. Take 1.2 my orally once, then one hour later take 0.6 mg orally.   HYDROCODONE-ACETAMINOPHEN (NORCO/VICODIN) 5-325 MG TABLET    Take 1 tablet by mouth every 8 (eight)  hours as needed for moderate pain or severe pain (do not drive or operate machinery while taking as can cause drowsiness).   PREDNISONE (DELTASONE) 10 MG TABLET    Start 60 mg po day one, then 50 mg po day two, taper by 10 mg daily until complete.    Note: This dictation was prepared with Dragon dictation along with smaller phrase technology. Any transcriptional errors that result from this process are unintentional.    Clinical Course       Renford Dills, NP 10/28/16 1508    Renford Dills, NP 10/28/16 [phone]

## 2016-10-28 NOTE — ED Triage Notes (Signed)
Patient complains of left foot pain that he believes may be gout. Patient states that symptoms started 5 days ago. Patient states that he has been having swelling, painful to walk and redness.

## 2018-01-15 DIAGNOSIS — M109 Gout, unspecified: Secondary | ICD-10-CM | POA: Insufficient documentation

## 2018-01-15 DIAGNOSIS — Z6841 Body Mass Index (BMI) 40.0 and over, adult: Secondary | ICD-10-CM | POA: Insufficient documentation

## 2019-03-19 ENCOUNTER — Encounter: Payer: Self-pay | Admitting: Emergency Medicine

## 2019-03-19 ENCOUNTER — Ambulatory Visit
Admission: EM | Admit: 2019-03-19 | Discharge: 2019-03-19 | Disposition: A | Discharge status: Home / Self Care | Payer: PRIVATE HEALTH INSURANCE | Attending: Family Medicine | Admitting: Family Medicine

## 2019-03-19 ENCOUNTER — Other Ambulatory Visit: Payer: Self-pay

## 2019-03-19 DIAGNOSIS — M25561 Pain in right knee: Secondary | ICD-10-CM

## 2019-03-19 MED ORDER — PREDNISONE 10 MG PO TABS: ORAL_TABLET | ORAL | 0 refills | Status: DC

## 2019-03-19 NOTE — Discharge Instructions (Signed)
Medication as prescribed.  Rest.  Take care  Dr. Junior Kenedy  

## 2019-03-19 NOTE — ED Provider Notes (Signed)
[facility]    CSN: [id number] Arrival date & time: 03/19/19  0859  History   Chief Complaint Chief Complaint  Patient presents with  . Knee Pain    right   HPI   63 year old male presents with knee pain.  Patient reports right knee pain since Tuesday.  Severe.  10/10 in severity.  Worse with range of motion.  Associated swelling.  No fall, trauma, injury.  Has a history of gout.  Also has a history of osteoarthritis.  He has been applying an Ace bandage without resolution.  Patient unable to bear full weight on his right leg.  No other interventions tried.  No other complaints or concerns at this time.  History reviewed and updated as below.  Past Medical History:  Diagnosis Date  . Gout   . Hypertension   Morbid obesity Osteoarthitis  Past Surgical History:  Procedure Laterality Date  . SKIN GRAFT     Home Medications    Prior to Admission medications   Medication Sig Start Date End Date Taking? Authorizing Provider  allopurinol (ZYLOPRIM) 300 MG tablet Take 300 mg by mouth daily.   Yes [provider]  amLODipine (NORVASC) 10 MG tablet Take 20 mg by mouth daily.   Yes [provider]  benazepril (LOTENSIN) 20 MG tablet Take 20 mg by mouth daily.   Yes [provider]  metoprolol tartrate (LOPRESSOR) 25 MG tablet Take by mouth.   Yes [provider]  colchicine 0.6 MG tablet Take 1 tablet (0.6 mg total) by mouth daily. Take 1.2 my orally once, then one hour later take 0.6 mg orally. 10/28/16   Renford DillsMiller, Lindsey, NP  predniSONE (DELTASONE) 10 MG tablet 50 mg daily x 3 days, then 40 mg daily x 3 days, then 30 mg daily x 3 days, then 20 mg daily x 3 days, then 10 mg daily x 3 days. 03/19/19   Tommie Samsook, Wah Sabic G, DO   Social History Social History   Tobacco Use  . Smoking status: Never Smoker  . Smokeless tobacco: Never Used  Substance Use Topics  . Alcohol use: Yes  . Drug use: No    Allergies   Patient has no known allergies.    Review of Systems Review of Systems  Constitutional: Negative.   Musculoskeletal:       Right knee pain.   Physical Exam Triage Vital Signs ED Triage Vitals  Enc Vitals Group     BP 03/19/19 0919 (!) 157/76     Pulse Rate 03/19/19 0919 67     Resp 03/19/19 0919 16     Temp 03/19/19 0919 98.3 F (36.8 C)     Temp Source 03/19/19 0919 Oral     SpO2 03/19/19 0919 96 %     Weight 03/19/19 0914 290 lb (131.5 kg)     Height 03/19/19 0914 5\' 11"  (1.803 m)     Head Circumference --      Peak Flow --      Pain Score 03/19/19 0914 10     Pain Loc --      Pain Edu? --      Excl. in GC? --    Updated Vital Signs BP (!) 157/76 (BP Location: Left Arm)   Pulse 67   Temp 98.3 F (36.8 C) (Oral)   Resp 16   Ht 5\' 11"  (1.803 m)   Wt 131.5 kg   SpO2 96%   BMI 40.45 kg/m   Visual Acuity Right  Eye Distance:   Left Eye Distance:   Bilateral Distance:    Right Eye Near:   Left Eye Near:    Bilateral Near:     Physical Exam Vitals signs and nursing note reviewed.  Constitutional:      General: He is not in acute distress.    Appearance: Normal appearance. He is obese.  HENT:     Head: Normocephalic and atraumatic.  Eyes:     General:        Right eye: No discharge.        Left eye: No discharge.     Conjunctiva/sclera: Conjunctivae normal.  Pulmonary:     Effort: Pulmonary effort is normal. No respiratory distress.  Musculoskeletal:     Comments: Right knee -swelling noted.  Exquisitely tender to palpation diffusely.  Markedly decreased range of motion in all planes secondary to pain.  Neurological:     Mental Status: He is alert.  Psychiatric:        Mood and Affect: Mood normal.        Behavior: Behavior normal.    UC Treatments / Results  Labs (all labs ordered are listed, but only abnormal results are displayed) Labs Reviewed - No data to display  EKG None  Radiology No results found.  Procedures Procedures (including critical care time)   Medications Ordered in UC Medications - No data to display  Initial Impression / Assessment and Plan / UC Course  I have reviewed the triage vital signs and the nursing notes.  Pertinent labs & imaging results that were available during my care of the patient were reviewed by me and considered in my medical decision making (see chart for details).    63 year old male presents with acute onset severe right knee pain.  Appears to be experiencing acute gout.  Patient likely has underlying osteoarthritis as well.  Placing on prednisone.  Final Clinical Impressions(s) / UC Diagnoses   Final diagnoses:  Acute pain of right knee     Discharge Instructions     Medication as prescribed.  Rest.  Take care  Dr. Adriana Simas    ED Prescriptions    Medication Sig Dispense Auth. Provider   predniSONE (DELTASONE) 10 MG tablet 50 mg daily x 3 days, then 40 mg daily x 3 days, then 30 mg daily x 3 days, then 20 mg daily x 3 days, then 10 mg daily x 3 days. 45 tablet Tommie Sams, DO     Controlled Substance Prescriptions [place] Controlled Substance Registry consulted? Not Applicable   Tommie Sams, DO 03/19/19 1119

## 2019-03-19 NOTE — ED Triage Notes (Signed)
Patient c/o right knee pain that started on Tuesday.  Patient denies injury or fall.  Patient reports history of gout.

## 2019-07-05 ENCOUNTER — Other Ambulatory Visit: Payer: Self-pay | Admitting: Orthopedic Surgery

## 2019-07-09 ENCOUNTER — Encounter (HOSPITAL_BASED_OUTPATIENT_CLINIC_OR_DEPARTMENT_OTHER): Payer: Self-pay | Admitting: *Deleted

## 2019-07-09 ENCOUNTER — Other Ambulatory Visit: Payer: Self-pay

## 2019-07-15 ENCOUNTER — Other Ambulatory Visit (HOSPITAL_COMMUNITY)
Admission: RE | Admit: 2019-07-15 | Discharge: 2019-07-15 | Disposition: A | Discharge status: Home / Self Care | Payer: PRIVATE HEALTH INSURANCE | Source: Ambulatory Visit | Attending: Orthopedic Surgery | Admitting: Orthopedic Surgery

## 2019-07-15 DIAGNOSIS — Z20828 Contact with and (suspected) exposure to other viral communicable diseases: Secondary | ICD-10-CM | POA: Diagnosis not present

## 2019-07-16 LAB — NOVEL CORONAVIRUS, NAA (HOSP ORDER, SEND-OUT TO REF LAB; TAT 18-24 HRS): SARS-CoV-2, NAA: NOT DETECTED

## 2019-07-16 NOTE — Progress Notes (Signed)
Called patient to remind him of his lab work and EKG that needed to be completed before Monday and to pick up his presurgical drink. Left message on patients phone.

## 2019-07-19 ENCOUNTER — Other Ambulatory Visit: Payer: Self-pay

## 2019-07-19 ENCOUNTER — Ambulatory Visit (HOSPITAL_BASED_OUTPATIENT_CLINIC_OR_DEPARTMENT_OTHER)
Admission: RE | Admit: 2019-07-19 | Discharge: 2019-07-19 | Disposition: A | Discharge status: Home / Self Care | Payer: Worker's Compensation | Source: Ambulatory Visit | Attending: Orthopedic Surgery | Admitting: Orthopedic Surgery

## 2019-07-19 ENCOUNTER — Ambulatory Visit (HOSPITAL_BASED_OUTPATIENT_CLINIC_OR_DEPARTMENT_OTHER): Payer: Worker's Compensation | Admitting: Anesthesiology

## 2019-07-19 ENCOUNTER — Encounter (HOSPITAL_BASED_OUTPATIENT_CLINIC_OR_DEPARTMENT_OTHER): Payer: Self-pay | Admitting: *Deleted

## 2019-07-19 ENCOUNTER — Encounter (HOSPITAL_BASED_OUTPATIENT_CLINIC_OR_DEPARTMENT_OTHER)
Admission: RE | Disposition: A | Discharge status: Home / Self Care | Payer: Self-pay | Source: Ambulatory Visit | Attending: Orthopedic Surgery

## 2019-07-19 DIAGNOSIS — G4730 Sleep apnea, unspecified: Secondary | ICD-10-CM | POA: Insufficient documentation

## 2019-07-19 DIAGNOSIS — E785 Hyperlipidemia, unspecified: Secondary | ICD-10-CM | POA: Diagnosis not present

## 2019-07-19 DIAGNOSIS — M94261 Chondromalacia, right knee: Secondary | ICD-10-CM | POA: Diagnosis not present

## 2019-07-19 DIAGNOSIS — F17290 Nicotine dependence, other tobacco product, uncomplicated: Secondary | ICD-10-CM | POA: Diagnosis not present

## 2019-07-19 DIAGNOSIS — R7303 Prediabetes: Secondary | ICD-10-CM | POA: Insufficient documentation

## 2019-07-19 DIAGNOSIS — M2241 Chondromalacia patellae, right knee: Secondary | ICD-10-CM | POA: Diagnosis present

## 2019-07-19 DIAGNOSIS — S83241A Other tear of medial meniscus, current injury, right knee, initial encounter: Secondary | ICD-10-CM | POA: Diagnosis present

## 2019-07-19 DIAGNOSIS — M23203 Derangement of unspecified medial meniscus due to old tear or injury, right knee: Secondary | ICD-10-CM | POA: Insufficient documentation

## 2019-07-19 DIAGNOSIS — I10 Essential (primary) hypertension: Secondary | ICD-10-CM | POA: Diagnosis not present

## 2019-07-19 DIAGNOSIS — Z6841 Body Mass Index (BMI) 40.0 and over, adult: Secondary | ICD-10-CM | POA: Diagnosis not present

## 2019-07-19 DIAGNOSIS — M23200 Derangement of unspecified lateral meniscus due to old tear or injury, right knee: Secondary | ICD-10-CM | POA: Insufficient documentation

## 2019-07-19 DIAGNOSIS — S83281A Other tear of lateral meniscus, current injury, right knee, initial encounter: Secondary | ICD-10-CM | POA: Diagnosis present

## 2019-07-19 DIAGNOSIS — M109 Gout, unspecified: Secondary | ICD-10-CM | POA: Diagnosis not present

## 2019-07-19 DIAGNOSIS — Z79899 Other long term (current) drug therapy: Secondary | ICD-10-CM | POA: Insufficient documentation

## 2019-07-19 HISTORY — PX: CHONDROPLASTY: SHX5177

## 2019-07-19 HISTORY — DX: Sleep apnea, unspecified: G47.30

## 2019-07-19 HISTORY — DX: Hyperlipidemia, unspecified: E78.5

## 2019-07-19 HISTORY — PX: KNEE ARTHROSCOPY WITH MEDIAL MENISECTOMY: SHX5651

## 2019-07-19 HISTORY — DX: Pain in right knee: M25.561

## 2019-07-19 HISTORY — PX: KNEE ARTHROSCOPY WITH LATERAL MENISECTOMY: SHX6193

## 2019-07-19 HISTORY — DX: Prediabetes: R73.03

## 2019-07-19 LAB — BASIC METABOLIC PANEL
Anion gap: 12 (ref 5–15)
BUN: 17 mg/dL (ref 8–23)
CO2: 26 mmol/L (ref 22–32)
Calcium: 9.1 mg/dL (ref 8.9–10.3)
Chloride: 103 mmol/L (ref 98–111)
Creatinine, Ser: 1.23 mg/dL (ref 0.61–1.24)
GFR calc Af Amer: >60 mL/min (ref >60)
GFR calc non Af Amer: >60 mL/min (ref >60)
Glucose, Bld: 97 mg/dL (ref 70–99)
Potassium: 4 mmol/L (ref 3.5–5.1)
Sodium: 141 mmol/L (ref 135–145)

## 2019-07-19 SURGERY — ARTHROSCOPY, KNEE, WITH MEDIAL MENISCECTOMY
Anesthesia: General | Site: Knee | Laterality: Right

## 2019-07-19 MED ORDER — OXYCODONE HCL 5 MG/5ML PO SOLN: 5.0000 mg | Freq: Once | ORAL | Status: DC | PRN

## 2019-07-19 MED ORDER — PROPOFOL 10 MG/ML IV BOLUS
INTRAVENOUS | Status: DC | PRN
  Administered 2019-07-19: 200 mg via INTRAVENOUS

## 2019-07-19 MED ORDER — ONDANSETRON HCL 4 MG/2ML IJ SOLN
INTRAMUSCULAR | Status: DC | PRN
  Administered 2019-07-19: 4 mg via INTRAVENOUS

## 2019-07-19 MED ORDER — DEXAMETHASONE SODIUM PHOSPHATE 10 MG/ML IJ SOLN
INTRAMUSCULAR | Status: DC | PRN
  Administered 2019-07-19: 5 mg via INTRAVENOUS

## 2019-07-19 MED ORDER — ACETAMINOPHEN 160 MG/5ML PO SOLN: 325.0000 mg | ORAL | Status: DC | PRN

## 2019-07-19 MED ORDER — LIDOCAINE HCL (CARDIAC) PF 100 MG/5ML IV SOSY
PREFILLED_SYRINGE | INTRAVENOUS | Status: DC | PRN
  Administered 2019-07-19: 100 mg via INTRAVENOUS

## 2019-07-19 MED ORDER — ONDANSETRON HCL 4 MG/2ML IJ SOLN: 4.0000 mg | Freq: Once | INTRAMUSCULAR | Status: DC | PRN

## 2019-07-19 MED ORDER — MIDAZOLAM HCL 2 MG/2ML IJ SOLN
INTRAMUSCULAR | Status: AC
  Filled 2019-07-19: qty 2

## 2019-07-19 MED ORDER — OXYCODONE-ACETAMINOPHEN 5-325 MG PO TABS: 1.0000 | ORAL_TABLET | Freq: Four times a day (QID) | ORAL | 0 refills | Status: DC | PRN

## 2019-07-19 MED ORDER — KETOROLAC TROMETHAMINE 30 MG/ML IJ SOLN
INTRAMUSCULAR | Status: DC | PRN
  Administered 2019-07-19: 30 mg via INTRAVENOUS

## 2019-07-19 MED ORDER — CEFAZOLIN SODIUM-DEXTROSE 2-4 GM/100ML-% IV SOLN
INTRAVENOUS | Status: AC
  Filled 2019-07-19: qty 100

## 2019-07-19 MED ORDER — PROPOFOL 10 MG/ML IV BOLUS
INTRAVENOUS | Status: AC
  Filled 2019-07-19: qty 20

## 2019-07-19 MED ORDER — ACETAMINOPHEN 325 MG PO TABS: 325.0000 mg | ORAL_TABLET | ORAL | Status: DC | PRN

## 2019-07-19 MED ORDER — MIDAZOLAM HCL 2 MG/2ML IJ SOLN
1.0000 mg | INTRAMUSCULAR | Status: DC | PRN
  Administered 2019-07-19: 2 mg via INTRAVENOUS

## 2019-07-19 MED ORDER — OXYCODONE HCL 5 MG PO TABS: 5.0000 mg | ORAL_TABLET | Freq: Once | ORAL | Status: DC | PRN

## 2019-07-19 MED ORDER — LIDOCAINE 2% (20 MG/ML) 5 ML SYRINGE
INTRAMUSCULAR | Status: AC
  Filled 2019-07-19: qty 5

## 2019-07-19 MED ORDER — SCOPOLAMINE 1 MG/3DAYS TD PT72: 1.0000 | MEDICATED_PATCH | Freq: Once | TRANSDERMAL | Status: DC

## 2019-07-19 MED ORDER — FENTANYL CITRATE (PF) 100 MCG/2ML IJ SOLN: 25.0000 ug | INTRAMUSCULAR | Status: DC | PRN

## 2019-07-19 MED ORDER — FENTANYL CITRATE (PF) 100 MCG/2ML IJ SOLN
INTRAMUSCULAR | Status: AC
  Filled 2019-07-19: qty 2

## 2019-07-19 MED ORDER — FENTANYL CITRATE (PF) 100 MCG/2ML IJ SOLN
50.0000 ug | INTRAMUSCULAR | Status: DC | PRN
  Administered 2019-07-19: 13:00:00 50 ug via INTRAVENOUS

## 2019-07-19 MED ORDER — MEPERIDINE HCL 25 MG/ML IJ SOLN: 6.2500 mg | INTRAMUSCULAR | Status: DC | PRN

## 2019-07-19 MED ORDER — DEXAMETHASONE SODIUM PHOSPHATE 10 MG/ML IJ SOLN
INTRAMUSCULAR | Status: AC
  Filled 2019-07-19: qty 1

## 2019-07-19 MED ORDER — CHLORHEXIDINE GLUCONATE 4 % EX LIQD: 60.0000 mL | Freq: Once | CUTANEOUS | Status: DC

## 2019-07-19 MED ORDER — BUPIVACAINE HCL (PF) 0.5 % IJ SOLN
INTRAMUSCULAR | Status: DC | PRN
  Administered 2019-07-19: 20 mL

## 2019-07-19 MED ORDER — CEFAZOLIN SODIUM-DEXTROSE 1-4 GM/50ML-% IV SOLN
INTRAVENOUS | Status: AC
  Filled 2019-07-19: qty 50

## 2019-07-19 MED ORDER — LACTATED RINGERS IV SOLN
INTRAVENOUS | Status: DC
  Administered 2019-07-19: 11:00:00 via INTRAVENOUS

## 2019-07-19 MED ORDER — ONDANSETRON HCL 4 MG/2ML IJ SOLN
INTRAMUSCULAR | Status: AC
  Filled 2019-07-19: qty 2

## 2019-07-19 MED ORDER — DEXTROSE 5 % IV SOLN
3.0000 g | INTRAVENOUS | Status: AC
  Administered 2019-07-19: 3 g via INTRAVENOUS

## 2019-07-19 MED ORDER — EPHEDRINE SULFATE 50 MG/ML IJ SOLN
INTRAMUSCULAR | Status: DC | PRN
  Administered 2019-07-19: 10 mg via INTRAVENOUS

## 2019-07-19 MED ORDER — SODIUM CHLORIDE 0.9 % IR SOLN
Status: DC | PRN
  Administered 2019-07-19: 3000 mL

## 2019-07-19 SURGICAL SUPPLY — 40 items
BLADE EXCALIBUR 4.0MM X 13CM (MISCELLANEOUS) ×1
BLADE EXCALIBUR 4.0X13 (MISCELLANEOUS) ×2 IMPLANT
BNDG ELASTIC 6X5.8 VLCR STR LF (GAUZE/BANDAGES/DRESSINGS) ×3 IMPLANT
COVER WAND RF STERILE (DRAPES) IMPLANT
DISSECTOR 4.0MM X 13CM (MISCELLANEOUS) ×2 IMPLANT
DRAPE ARTHROSCOPY W/POUCH 90 (DRAPES) ×3 IMPLANT
DRAPE OEC MINIVIEW 54X84 (DRAPES) ×1 IMPLANT
DRSG EMULSION OIL 3X3 NADH (GAUZE/BANDAGES/DRESSINGS) ×3 IMPLANT
DURAPREP 26ML APPLICATOR (WOUND CARE) ×3 IMPLANT
ELECT MENISCUS 165MM 90D (ELECTRODE) ×1 IMPLANT
ELECT REM PT RETURN 9FT ADLT (ELECTROSURGICAL)
ELECTRODE REM PT RTRN 9FT ADLT (ELECTROSURGICAL) ×1 IMPLANT
GAUZE SPONGE 4X4 12PLY STRL (GAUZE/BANDAGES/DRESSINGS) ×3 IMPLANT
GLOVE BIO SURGEON STRL SZ 6.5 (GLOVE) ×1 IMPLANT
GLOVE BIO SURGEONS STRL SZ 6.5 (GLOVE) ×1
GLOVE BIOGEL PI IND STRL 7.0 (GLOVE) IMPLANT
GLOVE BIOGEL PI IND STRL 8 (GLOVE) ×2 IMPLANT
GLOVE BIOGEL PI INDICATOR 7.0 (GLOVE) ×4
GLOVE BIOGEL PI INDICATOR 8 (GLOVE) ×4
GLOVE ECLIPSE 7.5 STRL STRAW (GLOVE) ×6 IMPLANT
GOWN STRL REUS W/ TWL LRG LVL3 (GOWN DISPOSABLE) ×1 IMPLANT
GOWN STRL REUS W/ TWL XL LVL3 (GOWN DISPOSABLE) ×1 IMPLANT
GOWN STRL REUS W/TWL LRG LVL3 (GOWN DISPOSABLE) ×2
GOWN STRL REUS W/TWL XL LVL3 (GOWN DISPOSABLE) ×5 IMPLANT
HOLDER KNEE FOAM BLUE (MISCELLANEOUS) ×3 IMPLANT
IV NS IRRIG 3000ML ARTHROMATIC (IV SOLUTION) ×6 IMPLANT
KNEE WRAP E Z 3 GEL PACK (MISCELLANEOUS) ×3 IMPLANT
MANIFOLD NEPTUNE II (INSTRUMENTS) ×2 IMPLANT
NDL SAFETY ECLIPSE 18X1.5 (NEEDLE) IMPLANT
NEEDLE HYPO 18GX1.5 SHARP (NEEDLE)
PACK ARTHROSCOPY DSU (CUSTOM PROCEDURE TRAY) ×3 IMPLANT
PACK BASIN DAY SURGERY FS (CUSTOM PROCEDURE TRAY) ×3 IMPLANT
PAD CAST 4YDX4 CTTN HI CHSV (CAST SUPPLIES) ×1 IMPLANT
PADDING CAST COTTON 4X4 STRL (CAST SUPPLIES) ×2
PENCIL BUTTON HOLSTER BLD 10FT (ELECTRODE) ×3 IMPLANT
SUT ETHILON 4 0 PS 2 18 (SUTURE) ×1 IMPLANT
SYR 5ML LL (SYRINGE) ×3 IMPLANT
TOWEL GREEN STERILE FF (TOWEL DISPOSABLE) ×3 IMPLANT
TUBING ARTHROSCOPY IRRIG 16FT (MISCELLANEOUS) ×3 IMPLANT
WATER STERILE IRR 1000ML POUR (IV SOLUTION) ×3 IMPLANT

## 2019-07-19 NOTE — Anesthesia Postprocedure Evaluation (Signed)
Anesthesia Post Note  Patient: Jonathan [name]  Procedure(s) Performed: KNEE ARTHROSCOPY WITH MEDIAL MENISECTOMY (Right Knee) KNEE ARTHROSCOPY WITH LATERAL MENISECTOMY (Right Knee) CHONDROPLASTY (Right Knee)     Patient location during evaluation: PACU Anesthesia Type: General Level of consciousness: awake and alert Pain management: pain level controlled Vital Signs Assessment: post-procedure vital signs reviewed and stable Respiratory status: spontaneous breathing, nonlabored ventilation, respiratory function stable and patient connected to nasal cannula oxygen Cardiovascular status: blood pressure returned to baseline and stable Postop Assessment: no apparent nausea or vomiting Anesthetic complications: no    Last Vitals:  Vitals:   07/19/19 1345 07/19/19 1435  BP: 121/86 128/85  Pulse: (!) 59 (!) 51  Resp: 17 18  Temp:  37.1 C  SpO2: 100% 99%    Last Pain:  Vitals:   07/19/19 1435  TempSrc:   PainSc: 3                  Leonor Darnell

## 2019-07-19 NOTE — Anesthesia Procedure Notes (Signed)
Procedure Name: LMA Insertion Date/Time: 07/19/2019 12:39 PM Performed by: Maryella Shivers, CRNA Pre-anesthesia Checklist: Patient identified, Emergency Drugs available, Suction available and Patient being monitored Patient Re-evaluated:Patient Re-evaluated prior to induction Oxygen Delivery Method: Circle system utilized Preoxygenation: Pre-oxygenation with 100% oxygen Induction Type: IV induction Ventilation: Mask ventilation without difficulty LMA: LMA inserted LMA Size: 5.0 Number of attempts: 1 Airway Equipment and Method: Bite block Placement Confirmation: positive ETCO2 Tube secured with: Tape Dental Injury: Teeth and Oropharynx as per pre-operative assessment

## 2019-07-19 NOTE — Discharge Instructions (Signed)
POST-OP KNEE ARTHROSCOPY INSTRUCTIONS  Dr. Alain Marion PA-C  Pain You will be expected to have a moderate amount of pain in the affected knee for approximately two weeks. However, the first two days will be the most severe pain. A prescription has been provided to take as needed for the pain. The pain can be reduced by applying ice packs to the knee for the first 1-2 weeks post surgery. Also, keeping the leg elevated on pillows will help alleviate the pain. If you develop any acute pain or swelling in your calf muscle, please call the doctor.  Activity It is preferred that you stay at bed rest for approximately 24 hours. However, you may go to the bathroom with help. Weight bearing as tolerated. You may begin the knee exercises the day of surgery. Discontinue crutches as the knee pain resolves.  Dressing Keep the dressing dry. If the ace bandage should wrinkle or roll up, this can be rewrapped to prevent ridges in the bandage. You may remove all dressings in 48 hours,  apply bandaids to each wound. You may shower on the 4th day after surgery but no tub bath.  Symptoms to report to your doctor Extreme pain Extreme swelling Temperature above 101 degrees Change in the feeling, color, or movement of your toes Redness, heat, or swelling at your incision  Exercise If is preferred that as soon as possible you try to do a straight leg raise without bending the knee and concentrate on bringing the heel of your foot off the bed up to approximately 45 degrees and hold for the count of 10 seconds. Repeat this at least 10 times three or four times per day. Additional exercises are provided below.  You are encouraged to bend the knee as tolerated.  Follow-Up Call to schedule a follow-up appointment in 5-7 days. Our office # is [phone].  POST-OP EXERCISES  Short Arc Quads  1. Lie on back with legs straight. Place towel roll under thigh, just above knee. 2. Tighten thigh muscles to  straighten knee and lift heel off bed. 3. Hold for slow count of five, then lower. 4. Do three sets of ten    Straight Leg Raises  1. Lie on back with operative leg straight and other leg bent. 2. Keeping operative leg completely straight, slowly lift operative leg so foot is 5 inches off bed. 3. Hold for slow count of five, then lower. 4. Do three sets of ten.    DO BOTH EXERCISES 2 TIMES A DAY  Ankle Pumps  Work/move the operative ankle and foot up and down 10 times every hour while awake.    Post Anesthesia Home Care Instructions  Activity: Get plenty of rest for the remainder of the day. A responsible individual must stay with you for 24 hours following the procedure.  For the next 24 hours, DO NOT: -Drive a car -Paediatric nurse -Drink alcoholic beverages -Take any medication unless instructed by your physician -Make any legal decisions or sign important papers.  Meals: Start with liquid foods such as gelatin or soup. Progress to regular foods as tolerated. Avoid greasy, spicy, heavy foods. If nausea and/or vomiting occur, drink only clear liquids until the nausea and/or vomiting subsides. Call your physician if vomiting continues.  Special Instructions/Symptoms: Your throat may feel dry or sore from the anesthesia or the breathing tube placed in your throat during surgery. If this causes discomfort, gargle with warm salt water. The discomfort should disappear within 24 hours.  If  you had a scopolamine patch placed behind your ear for the management of post- operative nausea and/or vomiting:  1. The medication in the patch is effective for 72 hours, after which it should be removed.  Wrap patch in a tissue and discard in the trash. Wash hands thoroughly with soap and water. 2. You may remove the patch earlier than 72 hours if you experience unpleasant side effects which may include dry mouth, dizziness or visual disturbances. 3. Avoid touching the patch. Wash your  hands with soap and water after contact with the patch.    Call your surgeon if you experience:   1.  Fever over 101.0. 2.  Inability to urinate. 3.  Nausea and/or vomiting. 4.  Extreme swelling or bruising at the surgical site. 5.  Continued bleeding from the incision. 6.  Increased pain, redness or drainage from the incision. 7.  Problems related to your pain medication. 8.  Any problems and/or concerns

## 2019-07-19 NOTE — H&P (Signed)
A pre op hand p   Chief Complaint: Right knee pain  HPI: Jonathan [name] is a 63 y.o. male who presents for evaluation of right knee pain. It has been present for greater than 91-month and has been worsening. He has failed conservative measures. Pain is rated as moderate.  Past Medical History:  Diagnosis Date  . Gout   . Hyperlipidemia   . Hypertension   . Knee pain, right   . Pre-diabetes   . Sleep apnea    uses CPAP nightly   Past Surgical History:  Procedure Laterality Date  . SKIN GRAFT     left arm   Social History   Socioeconomic History  . Marital status: Divorced    Spouse name: Not on file  . Number of children: Not on file  . Years of education: Not on file  . Highest education level: Not on file  Occupational History  . Not on file  Social Needs  . Financial resource strain: Not on file  . Food insecurity    Worry: Not on file    Inability: Not on file  . Transportation needs    Medical: Not on file    Non-medical: Not on file  Tobacco Use  . Smoking status: Current Some Day Smoker    Types: Cigars  . Smokeless tobacco: Never Used  Substance and Sexual Activity  . Alcohol use: Yes    Comment: social  . Drug use: No  . Sexual activity: Not on file  Lifestyle  . Physical activity    Days per week: Not on file    Minutes per session: Not on file  . Stress: Not on file  Relationships  . Social Herbalist on phone: Not on file    Gets together: Not on file    Attends religious service: Not on file    Active member of club or organization: Not on file    Attends meetings of clubs or organizations: Not on file    Relationship status: Not on file  Other Topics Concern  . Not on file  Social History Narrative  . Not on file   History reviewed. No pertinent family history. No Known Allergies Prior to Admission medications   Medication Sig Start Date End Date Taking? Authorizing Provider  allopurinol (ZYLOPRIM) 300 MG tablet Take 300  mg by mouth daily.   Yes [provider]  amLODipine (NORVASC) 10 MG tablet Take 20 mg by mouth daily.   Yes [provider]  atorvastatin (LIPITOR) 20 MG tablet Take 20 mg by mouth daily.   Yes [provider]  benazepril (LOTENSIN) 20 MG tablet Take 20 mg by mouth daily.   Yes [provider]  furosemide (LASIX) 20 MG tablet Take 20 mg by mouth daily.   Yes [provider]  metoprolol tartrate (LOPRESSOR) 25 MG tablet Take by mouth.   Yes [provider]     Positive ROS: None  All other systems have been reviewed and were otherwise negative with the exception of those mentioned in the HPI and as above.  Physical Exam: Vitals:   07/19/19 1016  BP: (!) 142/78  Pulse: 63  Resp: 20  Temp: 98.3 F (36.8 C)  SpO2: 99%    General: Alert, no acute distress Cardiovascular: No pedal edema Respiratory: No cyanosis, no use of accessory musculature GI: No organomegaly, abdomen is soft and non-tender Skin: No lesions in the area of chief complaint Neurologic: Sensation  intact distally Psychiatric: Patient is competent for consent with normal mood and affect Lymphatic: No axillary or cervical lymphadenopathy  MUSCULOSKELETAL: Right knee: Painful range of motion.  Limited range of motion.  Positive medial lateral joint line tenderness.  No instability.  Trace effusion.  MRI: MRI shows medial lateral meniscal tears with chondromalacia.  Assessment/Plan: LATERAL AND MEDIAL MENISCUS TEAR RIGHT KNEE Plan for Procedure(s): KNEE ARTHROSCOPY   The risks benefits and alternatives were discussed with the patient including but not limited to the risks of nonoperative treatment, versus surgical intervention including infection, bleeding, nerve injury, malunion, nonunion, hardware prominence, hardware failure, need for hardware removal, blood clots, cardiopulmonary complications, morbidity, mortality, among others, and they were willing to  proceed.  Predicted outcome is good, although there will be at least a six to nine month expected recovery.  Harvie Junior, MD 07/19/2019 12:29 PM

## 2019-07-19 NOTE — Transfer of Care (Signed)
Immediate Anesthesia Transfer of Care Note  Patient: Jonathan [name]  Procedure(s) Performed: KNEE ARTHROSCOPY WITH LATERAL RELEASE and  SUBCHONDROPLASTY (Right Knee)  Patient Location: PACU  Anesthesia Type:General  Level of Consciousness: sedated  Airway & Oxygen Therapy: Patient Spontanous Breathing and Patient connected to face mask oxygen  Post-op Assessment: Report given to RN and Post -op Vital signs reviewed and stable  Post vital signs: Reviewed and stable  Last Vitals:  Vitals Value Taken Time  BP 113/75 07/19/19 1321  Temp    Pulse 60 07/19/19 1322  Resp 15 07/19/19 1322  SpO2 100 % 07/19/19 1322  Vitals shown include unvalidated device data.  Last Pain:  Vitals:   07/19/19 1016  TempSrc: Oral  PainSc: 0-No pain         Complications: No apparent anesthesia complications

## 2019-07-19 NOTE — Anesthesia Preprocedure Evaluation (Addendum)
Anesthesia Evaluation  Patient identified by MRN, date of birth, ID band Patient awake    Reviewed: Allergy & Precautions, H&P , NPO status , Patient's Chart, lab work & pertinent test results, reviewed documented beta blocker date and time   Airway Mallampati: II  TM Distance: >3 FB Neck ROM: full    Dental no notable dental hx. (+) Chipped, Loose, Missing, Poor Dentition,    Pulmonary sleep apnea and Continuous Positive Airway Pressure Ventilation , Current Smoker,    Pulmonary exam normal breath sounds clear to auscultation       Cardiovascular Exercise Tolerance: Good hypertension, Pt. on medications and Pt. on home beta blockers  Rhythm:regular Rate:Normal     Neuro/Psych negative neurological ROS  negative psych ROS   GI/Hepatic negative GI ROS, Neg liver ROS,   Endo/Other  Morbid obesity  Renal/GU negative Renal ROS  negative genitourinary   Musculoskeletal negative musculoskeletal ROS (+)   Abdominal   Peds negative pediatric ROS (+)  Hematology negative hematology ROS (+)   Anesthesia Other Findings   Reproductive/Obstetrics negative OB ROS                           Anesthesia Physical Anesthesia Plan  ASA: III  Anesthesia Plan: General   Post-op Pain Management:    Induction: Intravenous  PONV Risk Score and Plan: 2 and Ondansetron, Dexamethasone, Treatment may vary due to age or medical condition and Midazolam  Airway Management Planned: LMA and Oral ETT  Additional Equipment:   Intra-op Plan:   Post-operative Plan:   Informed Consent: I have reviewed the patients History and Physical, chart, labs and discussed the procedure including the risks, benefits and alternatives for the proposed anesthesia with the patient or authorized representative who has indicated his/her understanding and acceptance.     Dental Advisory Given  Plan Discussed with: CRNA,  Anesthesiologist and Surgeon  Anesthesia Plan Comments: ( )        Anesthesia Quick Evaluation

## 2019-07-20 ENCOUNTER — Encounter (HOSPITAL_BASED_OUTPATIENT_CLINIC_OR_DEPARTMENT_OTHER): Payer: Self-pay | Admitting: Orthopedic Surgery

## 2019-07-20 NOTE — Op Note (Signed)
NAME: Jonathan [name], THOMA MEDICAL RECORD IO:[id number] ACCOUNT [id number] DATE OF BIRTH:1956-04-17 FACILITY: [facility] LOCATION: [facility] PHYSICIAN:Jameah Rouser L. Kouper Spinella, MD  OPERATIVE REPORT  DATE OF PROCEDURE:  07/19/2019  PREOPERATIVE DIAGNOSIS:  Medial and lateral meniscus tears.  POSTOPERATIVE DIAGNOSES:   1.  Medial and lateral meniscus tears. 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  PROCEDURES: 1.  Right knee arthroscopy with partial medial and partial lateral meniscectomies. 2.  Chondroplasty of the medial femoral condyle and patellofemoral joint.  SURGEON:  Dorna Leitz, MD  ASSISTANT:  Gaspar Skeeters PA-C, was present for the entire case and assisted by manipulation of the leg and closing to minimize OR time.  BRIEF HISTORY:  The patient is a 63 year old male with a long history of complaints of right knee pain.  He had been treated conservatively for a prolonged period of time.  After failure of all conservative care, he is taken to the operating room for  right knee arthroscopy.  He had a preoperative MRI showing medial and lateral meniscal tears as well as some chondromalacia and effusion.  DESCRIPTION OF PROCEDURE:  The patient brought to the operating room after adequate anesthesia was obtained with a general anesthetic.  The patient was brought on the back table.  Right leg was prepped and draped in usual sterile fashion.  Following  this, routine arthroscopic examination of the knee revealed there was a posterior horn medial meniscal tear at the meniscal root.  This was debrided back to a smooth stable rim.  The meniscus was then stable in all directions after debridement and  smoothing of the meniscus.  The medial femoral condyle had grade II and III chondromalacia, which was debrided with a suction shaver back to smooth stable rim.  Attention was turned to the ACL, normal.  Attention was turned to the lateral side, which had  a radial tear in the mid  body.  This was resected with a biter and the final meniscal rim was contoured down with a suction shaver.  Lateral femoral condyle and tibial plateau had no significant evidence of arthritis.  Attention was then turned to  patellofemoral joint where there was fairly significant chondromalacia of the posterior surface of the patella as well as in the patellofemoral trochlea.  This was debrided with a suction shaver back to a smooth stable rim of articular cartilage.  At  this point, the knee was copiously and thoroughly irrigated and suctioned dry.  It was instilled with 20 mL of 0.25% Marcaine for postoperative anesthesia.  At this point, sterile compressive dressing was applied.  The patient was taken to recovery was  noted to be in satisfactory condition.  Estimated blood loss for procedure was minimal.  TN/NUANCE  D:07/20/2019 T:07/20/2019 JOB:[id number]

## 2020-09-05 ENCOUNTER — Ambulatory Visit (INDEPENDENT_AMBULATORY_CARE_PROVIDER_SITE_OTHER): Payer: Medicare (Managed Care) | Admitting: Internal Medicine

## 2020-09-05 VITALS — BP 138/72 | HR 76 | Resp 18 | Ht 71.0 in | Wt 307.0 lb

## 2020-09-05 DIAGNOSIS — G4733 Obstructive sleep apnea (adult) (pediatric): Secondary | ICD-10-CM | POA: Diagnosis not present

## 2020-09-05 DIAGNOSIS — Z6841 Body Mass Index (BMI) 40.0 and over, adult: Secondary | ICD-10-CM

## 2020-09-05 DIAGNOSIS — Z7189 Other specified counseling: Secondary | ICD-10-CM | POA: Insufficient documentation

## 2020-09-05 DIAGNOSIS — I10 Essential (primary) hypertension: Secondary | ICD-10-CM | POA: Diagnosis not present

## 2020-09-05 DIAGNOSIS — Z9989 Dependence on other enabling machines and devices: Secondary | ICD-10-CM

## 2020-09-05 NOTE — Progress Notes (Signed)
[facility] [address] [place], [place] [address]  Pulmonary Sleep Medicine   Office Visit Note  Patient Name: Jonathan [name] DOB: November 22, 1955 MRN [id number]    Chief Complaint: Obstructive Sleep Apnea visit  Brief History:  Jonathan [name] is seen today for initial consultation, The patient has a one eyar history of sleep apnea. Patient is not using PAP nightly.  His mask has been broken. He goes to bed by 11 p.m. and gets up 5:30 -6 a.m. and feels ok.  The patient feels no differnt after sleeping with PAP.  The patient reports no benefit from PAP use. Reported sleepiness is  Not changed and the Epworth Sleepiness Score is 10 out of 24. He is not sure how to use the CPAP and what to expect. The patient complains of the following:  Mask leak and dryness.   The compliance download shows poor compliance with an average use time of 7.2 hours. The AHI is 3.7  The patient does not complain of limb movements disrupting sleep.  ROS  General: (-) fever, (-) chills, (-) night sweat Nose and Sinuses: (-) nasal stuffiness or itchiness, (-) postnasal drip, (-) nosebleeds, (-) sinus trouble. Mouth and Throat: (-) sore throat, (-) hoarseness. Neck: (-) swollen glands, (-) enlarged thyroid, (-) neck pain. Respiratory: - cough, - shortness of breath, - wheezing. Neurologic: - numbness, - tingling. Psychiatric: - anxiety, - depression   Current Medication: Outpatient Encounter Medications as of 09/05/2020  Medication Sig  . meloxicam (MOBIC) 15 MG tablet TAKE 1 TABLET BY MOUTH EVERY DAY WITH MEALS  . acetaminophen (TYLENOL) 650 MG CR tablet Take by mouth.  Marland Kitchen allopurinol (ZYLOPRIM) 300 MG tablet Take 300 mg by mouth daily.  Marland Kitchen amLODipine (NORVASC) 10 MG tablet Take 20 mg by mouth daily.  Marland Kitchen atorvastatin (LIPITOR) 20 MG tablet Take 20 mg by mouth daily.  . benazepril (LOTENSIN) 20 MG tablet Take 20 mg by mouth daily.  . colchicine 0.6 MG tablet Take by mouth.  . diclofenac Sodium  (VOLTAREN) 1 % GEL 2 g every 6 (six) hours.  . folic acid (FOLVITE) 1 MG tablet Take 1 mg by mouth daily.  . furosemide (LASIX) 20 MG tablet Take 20 mg by mouth daily.  Marland Kitchen losartan (COZAAR) 25 MG tablet Take 25 mg by mouth daily.  . methotrexate (RHEUMATREX) 2.5 MG tablet Take by mouth.  . metoprolol tartrate (LOPRESSOR) 25 MG tablet Take by mouth.  . predniSONE (DELTASONE) 10 MG tablet Take by mouth.  . [DISCONTINUED] oxyCODONE-acetaminophen (PERCOCET/ROXICET) 5-325 MG tablet Take 1-2 tablets by mouth every 6 (six) hours as needed for severe pain.   No facility-administered encounter medications on file as of 09/05/2020.    Surgical History: Past Surgical History:  Procedure Laterality Date  . CHONDROPLASTY Right 07/19/2019   Procedure: CHONDROPLASTY;  Surgeon: Jodi Geralds, MD;  Location: [facility];  Service: Orthopedics;  Laterality: Right;  . KNEE ARTHROSCOPY WITH LATERAL MENISECTOMY Right 07/19/2019   Procedure: KNEE ARTHROSCOPY WITH LATERAL MENISECTOMY;  Surgeon: Jodi Geralds, MD;  Location: [facility];  Service: Orthopedics;  Laterality: Right;  . KNEE ARTHROSCOPY WITH MEDIAL MENISECTOMY Right 07/19/2019   Procedure: KNEE ARTHROSCOPY WITH MEDIAL MENISECTOMY;  Surgeon: Jodi Geralds, MD;  Location: [facility];  Service: Orthopedics;  Laterality: Right;  . SKIN GRAFT     left arm    Medical History: Past Medical History:  Diagnosis Date  . Gout   . Hyperlipidemia   . Hypertension   .  Knee pain, right   . Pre-diabetes   . Sleep apnea    uses CPAP nightly    Family History: Non contributory to the present illness  Social History: Social History   Socioeconomic History  . Marital status: Divorced    Spouse name: Not on file  . Number of children: Not on file  . Years of education: Not on file  . Highest education level: Not on file  Occupational History  . Not on file  Tobacco Use  . Smoking status: Current Some Day Smoker     Types: Cigars  . Smokeless tobacco: Never Used  Vaping Use  . Vaping Use: Never used  Substance and Sexual Activity  . Alcohol use: Yes    Comment: social  . Drug use: No  . Sexual activity: Not on file  Other Topics Concern  . Not on file  Social History Narrative  . Not on file   Social Determinants of Health   Financial Resource Strain:   . Difficulty of Paying Living Expenses: Not on file  Food Insecurity:   . Worried About Programme researcher, broadcasting/film/video in the Last Year: Not on file  . Ran Out of Food in the Last Year: Not on file  Transportation Needs:   . Lack of Transportation (Medical): Not on file  . Lack of Transportation (Non-Medical): Not on file  Physical Activity:   . Days of Exercise per Week: Not on file  . Minutes of Exercise per Session: Not on file  Stress:   . Feeling of Stress : Not on file  Social Connections:   . Frequency of Communication with Friends and Family: Not on file  . Frequency of Social Gatherings with Friends and Family: Not on file  . Attends Religious Services: Not on file  . Active Member of Clubs or Organizations: Not on file  . Attends Banker Meetings: Not on file  . Marital Status: Not on file  Intimate Partner Violence:   . Fear of Current or Ex-Partner: Not on file  . Emotionally Abused: Not on file  . Physically Abused: Not on file  . Sexually Abused: Not on file    Vital Signs: Blood pressure 138/72, pulse 76, resp. rate 18, height 5\' 11"  (1.803 m), weight (!) 307 lb (139.3 kg), SpO2 98 %.  Examination: General Appearance: The patient is well-developed, well-nourished, and in no distress. Neck Circumference: 53cm Skin: Gross inspection of skin unremarkable. Head: normocephalic, no gross deformities. Eyes: no gross deformities noted. ENT: ears appear grossly normal Neurologic: Alert and oriented. No involuntary movements.    EPWORTH SLEEPINESS SCALE:  Scale:  (0)= no chance of dozing; (1)= slight chance  of dozing; (2)= moderate chance of dozing; (3)= high chance of dozing  Chance  Situtation    Sitting and reading: 2    Watching TV: 3    Sitting Inactive in public: 1    As a passenger in car: 2      Lying down to rest: 1    Sitting and talking: 0    Sitting quielty after lunch: 1    In a car, stopped in traffic: 10   TOTAL SCORE:   10 out of 24    SLEEP STUDIES:  1. HST Sleep Study 05/12/2020 AHI 8.5 Spo2 80%     CPAP COMPLIANCE DATA:  Date Range: 07/14/2020-07/13/2020  Average Daily Use:  hours  Median Use: 7  Compliance for > 4 Hours: 7 days  AHI: 3.7  respiratory events per hour  Days Used: 238/365  Mask Leak: 10.3  95th Percentile Pressure: APAP 6-20cmH20         LABS: No results found for this or any previous visit (from the past 2160 hour(s)).  Radiology: No results found.  No results found.  No results found.    Assessment and Plan: Patient Active Problem List   Diagnosis Date Noted  . Essential hypertension 09/05/2020  . OSA on CPAP 09/05/2020  . CPAP use counseling 09/05/2020  . Acute medial meniscus tear of right knee 07/19/2019  . Acute lateral meniscal tear, right, initial encounter 07/19/2019  . 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 Gout 01/15/2018  . Morbid obesity with BMI of 40.0-44.9, adult (HCC) 01/15/2018      The patient does tolerate PAP and reports no significant benefit from PAP use. His mask is a year old and it is leaking.. The patient was reminded how to clean the unit and advised to use the CPAP nightly. He will need a mask fit and reeducation session.. The patient was also counselled on the importance of weight loss in treating sleep apnea. The compliance is poor. The AHI is controlled but variable likely due to the leak.   1. OSA- increase CPAP use to nightly and schedule a mask fit session. 2. CPAP couseling-Discussed importance of adequate CPAP use as well as proper care and cleaning  techniques of machine and all supplies. 3. Morbid Obesity - Discussed the importance of weight management through healthy eating and daily exercise as tolerated. Discussed the negative effects obesity has on pulmonary health, cardiac health as well as overall general health and well being. 4.   HTN - manual BP taken in clinic today and WNL. Pt states PCP recently changed  BP medications and being followed closely by PCP. Pt reports home Bps 130-140s/80-90s.   General Counseling: I have discussed the findings of the evaluation and examination with Ludwin.  I have also discussed any further diagnostic evaluation thatmay be needed or ordered today. Fadi verbalizes understanding of the findings of todays visit. We also reviewed his medications today and discussed drug interactions and side effects including but not limited excessive drowsiness and altered mental states. We also discussed that there is always a risk not just to him but also people around him. he has been encouraged to call the office with any questions or concerns that should arise related to todays visit.  No orders of the defined types were placed in this encounter.  This patient was seen by Layla Barter, AGNP-C in collaboration with Dr. Freda Munro as a part of collaborative care agreement.   I have personally obtained a history, examined the patient, evaluated laboratory and imaging results, formulated the assessment and plan and placed orders.   Valentino Hue Sol Blazing, PhD, FAASM  Diplomate, American Board of Sleep Medicine    Yevonne Pax, MD [facility] Diplomate ABMS Pulmonary and Critical Care Medicine Sleep medicine

## 2020-09-05 NOTE — Patient Instructions (Signed)

## 2020-11-07 NOTE — Progress Notes (Signed)
No show for appointment. Office will call to reschedule.  

## 2020-11-08 ENCOUNTER — Ambulatory Visit: Payer: Medicare Other | Admitting: Internal Medicine

## 2021-03-06 DIAGNOSIS — M0579 Rheumatoid arthritis with rheumatoid factor of multiple sites without organ or systems involvement: Secondary | ICD-10-CM | POA: Insufficient documentation

## 2021-12-24 NOTE — Progress Notes (Unsigned)
[facility] [address] [place], [place] [address]  Pulmonary Sleep Medicine   Office Visit Note  Patient Name: Jonathan [name] DOB: 1955/12/02 MRN [id number]    Chief Complaint: Obstructive Sleep Apnea visit  Brief History:  Jonathan [name] is seen today for an annual follow up visit for CPAP@ 10 cmH2O. The patient has a 2 year history of sleep apnea. Patient *** using PAP nightly.  The patient feels *** after sleeping with PAP.  The patient reports *** from PAP use. Reported sleepiness is  *** and the Epworth Sleepiness Score is *** out of 24. The patient *** take naps. The patient complains of the following: ***  The compliance download shows  compliance with an average use time of *** hours. The AHI is ***  The patient *** of limb movements disrupting sleep.  ROS  General: (-) fever, (-) chills, (-) night sweat Nose and Sinuses: (-) nasal stuffiness or itchiness, (-) postnasal drip, (-) nosebleeds, (-) sinus trouble. Mouth and Throat: (-) sore throat, (-) hoarseness. Neck: (-) swollen glands, (-) enlarged thyroid, (-) neck pain. Respiratory: *** cough, *** shortness of breath, *** wheezing. Neurologic: *** numbness, *** tingling. Psychiatric: *** anxiety, *** depression   Current Medication: Outpatient Encounter Medications as of 12/25/2021  Medication Sig   acetaminophen (TYLENOL) 650 MG CR tablet Take by mouth.   allopurinol (ZYLOPRIM) 300 MG tablet Take 300 mg by mouth daily.   amLODipine (NORVASC) 10 MG tablet Take 20 mg by mouth daily.   atorvastatin (LIPITOR) 20 MG tablet Take 20 mg by mouth daily.   benazepril (LOTENSIN) 20 MG tablet Take 20 mg by mouth daily.   colchicine 0.6 MG tablet Take by mouth.   diclofenac Sodium (VOLTAREN) 1 % GEL 2 g every 6 (six) hours.   folic acid (FOLVITE) 1 MG tablet Take 1 mg by mouth daily.   furosemide (LASIX) 20 MG tablet Take 20 mg by mouth daily.   losartan (COZAAR) 25 MG tablet Take 25 mg by mouth daily.   meloxicam  (MOBIC) 15 MG tablet TAKE 1 TABLET BY MOUTH EVERY DAY WITH MEALS   methotrexate (RHEUMATREX) 2.5 MG tablet Take by mouth.   metoprolol tartrate (LOPRESSOR) 25 MG tablet Take by mouth.   predniSONE (DELTASONE) 10 MG tablet Take by mouth.   No facility-administered encounter medications on file as of 12/25/2021.    Surgical History: Past Surgical History:  Procedure Laterality Date   CHONDROPLASTY Right 07/19/2019   Procedure: CHONDROPLASTY;  Surgeon: Jodi Geralds, MD;  Location: [facility];  Service: Orthopedics;  Laterality: Right;   KNEE ARTHROSCOPY WITH LATERAL MENISECTOMY Right 07/19/2019   Procedure: KNEE ARTHROSCOPY WITH LATERAL MENISECTOMY;  Surgeon: Jodi Geralds, MD;  Location: [facility];  Service: Orthopedics;  Laterality: Right;   KNEE ARTHROSCOPY WITH MEDIAL MENISECTOMY Right 07/19/2019   Procedure: KNEE ARTHROSCOPY WITH MEDIAL MENISECTOMY;  Surgeon: Jodi Geralds, MD;  Location: [facility];  Service: Orthopedics;  Laterality: Right;   SKIN GRAFT     left arm    Medical History: Past Medical History:  Diagnosis Date   Gout    Hyperlipidemia    Hypertension    Knee pain, right    Pre-diabetes    Sleep apnea    uses CPAP nightly    Family History: Non contributory to the present illness  Social History: Social History   Socioeconomic History   Marital status: Divorced    Spouse name: Not on file   Number  of children: Not on file   Years of education: Not on file   Highest education level: Not on file  Occupational History   Not on file  Tobacco Use   Smoking status: Some Days    Types: Cigars   Smokeless tobacco: Never  Vaping Use   Vaping Use: Never used  Substance and Sexual Activity   Alcohol use: Yes    Comment: social   Drug use: No   Sexual activity: Not on file  Other Topics Concern   Not on file  Social History Narrative   Not on file   Social Determinants of Health   Financial Resource Strain:  Not on file  Food Insecurity: Not on file  Transportation Needs: Not on file  Physical Activity: Not on file  Stress: Not on file  Social Connections: Not on file  Intimate Partner Violence: Not on file    Vital Signs: There were no vitals taken for this visit. There is no height or weight on file to calculate BMI.    Examination: General Appearance: The patient is well-developed, well-nourished, and in no distress. Neck Circumference: *** Skin: Gross inspection of skin unremarkable. Head: normocephalic, no gross deformities. Eyes: no gross deformities noted. ENT: ears appear grossly normal Neurologic: Alert and oriented. No involuntary movements.    EPWORTH SLEEPINESS SCALE:  Scale:  (0)= no chance of dozing; (1)= slight chance of dozing; (2)= moderate chance of dozing; (3)= high chance of dozing  Chance  Situtation    Sitting and reading: ***    Watching TV: ***    Sitting Inactive in public: ***    As a passenger in car: ***      Lying down to rest: ***    Sitting and talking: ***    Sitting quielty after lunch: ***    In a car, stopped in traffic: ***   TOTAL SCORE:   *** out of 24    SLEEP STUDIES:  HST (04/2020) AHI 9/hr, min SpO2 80% , APAP@ 6-20 cmH2O due to AASM Covid 19 recommendation.   CPAP COMPLIANCE DATA:  Date Range: ***  Average Daily Use: *** hours  Median Use: ***  Compliance for > 4 Hours: *** days  AHI: *** respiratory events per hour  Days Used: ***  Mask Leak: ***  95th Percentile Pressure: ***         LABS: No results found for this or any previous visit (from the past 2160 hour(s)).  Radiology: No results found.  No results found.  No results found.    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 The patient was reminded how to *** and advised to ***. The patient was also counselled on ***. The compliance is ***. The AHI is ***.   ***  General Counseling: I have discussed the findings of the evaluation and examination with Jonathan [name].  I have also discussed any further diagnostic evaluation thatmay be needed or ordered today. Jonathan [name] verbalizes understanding of the findings of todays visit. We also  reviewed his medications today and discussed drug interactions and side effects including but not limited excessive drowsiness and altered mental states. We also discussed that there is always a risk not just to him but also people around him. he has been encouraged to call the office with any questions or concerns that should arise related to todays visit.  No orders of the defined types were placed in this encounter.       I have personally obtained a history, examined the patient, evaluated laboratory and imaging results, formulated the assessment and plan and placed orders.  Yevonne Pax, MD [facility] Diplomate ABMS Pulmonary Critical Care Medicine and Sleep Medicine

## 2021-12-25 ENCOUNTER — Ambulatory Visit (INDEPENDENT_AMBULATORY_CARE_PROVIDER_SITE_OTHER): Payer: Medicare HMO | Admitting: Internal Medicine

## 2021-12-25 VITALS — BP 178/86 | HR 84 | Resp 18 | Ht 71.0 in | Wt 308.0 lb

## 2021-12-25 DIAGNOSIS — G4733 Obstructive sleep apnea (adult) (pediatric): Secondary | ICD-10-CM | POA: Diagnosis not present

## 2021-12-25 DIAGNOSIS — Z6841 Body Mass Index (BMI) 40.0 and over, adult: Secondary | ICD-10-CM

## 2021-12-25 DIAGNOSIS — Z7189 Other specified counseling: Secondary | ICD-10-CM

## 2021-12-25 DIAGNOSIS — I10 Essential (primary) hypertension: Secondary | ICD-10-CM | POA: Diagnosis not present

## 2021-12-25 DIAGNOSIS — Z9989 Dependence on other enabling machines and devices: Secondary | ICD-10-CM

## 2021-12-25 NOTE — Patient Instructions (Signed)

## 2022-07-27 ENCOUNTER — Encounter (INDEPENDENT_AMBULATORY_CARE_PROVIDER_SITE_OTHER): Payer: Medicare HMO | Admitting: Internal Medicine

## 2022-07-27 DIAGNOSIS — G4733 Obstructive sleep apnea (adult) (pediatric): Secondary | ICD-10-CM | POA: Diagnosis not present

## 2022-07-27 DIAGNOSIS — G4719 Other hypersomnia: Secondary | ICD-10-CM

## 2022-08-06 NOTE — Procedures (Signed)
[facility] Report Part I                                                                 Phone: [phone] Fax: [phone]  Patient Name: Jonathan [name], Jonathan [name] Acquisition Number: [id number]  Date of Birth: 05/06/1956 Acquisition Date: 07/27/2022  Referring Physician: Tor Netters MD     History: The patient is a 66 year old male who was referred for re-evaluation of obstructive sleep apnea. Medical History: OSA, essential hypertension, arthritis, gout.  Medications: acetaminophen, allopurinol, amlodipine, atorvastatin, colchicine, diclofenac, losartan, meloxicam, methotrexate, metoprolol, prednisone,  Procedure: This routine overnight polysomnogram was performed on the [facility] 5 using the standard diagnostic protocol. This included 6 channels of EEG, 2 channels of EOG, chin EMG, bilateral anterior tibialis EMG, nasal/oral thermistor, PTAF (nasal pressure transducer), chest and abdominal wall movements, EKG, and pulse oximetry.  Description: The total recording time was 442.2 minutes. The total sleep time was 369.0 minutes. There were a total of 61.2 minutes of wakefulness after sleep onset for areducedsleep efficiency of 83.4%. The latency to sleep onset was within normal limits at 12.0 minutes. The R sleep onset latency was prolonged at 278.0 minutes. Sleep parameters, as a percentage of the total sleep time, demonstrated 3.9% of sleep was in N1 sleep, 85.4% N2, 0.0% N3 and 10.7% R sleep. There were a total of 9 arousals for an arousal index of 1.5 arousals per hour of sleep that was normal.  Respiratory monitoring demonstrated no significant snoring. The patient slept on his back in a recliner. Only 2 respiratory events were observed during the study. The baseline oxygen saturation during wakefulness was 95%, during NREM sleep averaged 94%, and during REM sleep averaged  94%. The total duration of oxygen < 90% was 0.3 minutes.  Cardiac monitoring- There were no  significant cardiac rhythm irregularities.   Periodic limb movement monitoring- did not demonstrate periodic limb movements.   Impression: This routine overnight polysomnogram did not demonstrate significant obstructive sleep apnea with only 2 respiratory events observed. The patient slept on his back in a recliner for the study. This may have caused an underestimation of the presence of sleep apnea. At this time he reports sleeping in his recliner at home, however if he returns to sleeping in his bed, a repeat study may be advised.  There was a reduced sleep efficiency with a reduced REM percentage and no slow wave sleep. The patient reported difficulty sleeping in the laboratory.   Recommendations:     Would recommend weight loss in a patient with a BMI of 43.0 lb/in2     Allyne Gee, MD, [facility] Diplomate ABMS-Pulmonary, Critical Care and Sleep Medicine  Electronically reviewed and digitally signed  [place] Report Part II  Phone: [phone] Fax: [phone]  Patient last name [name] Neck Size 18.5 in. Acquisition [id number]  Patient first name Jonathan Weight 308.0 lbs. Started 07/27/2022 at 10:14:20 PM  Birth date [birth year] Height 71.0 in. Stopped 07/28/2022 at 5:44:02 AM  Age 22 BMI 43.0 lb/in2 Duration 442.2  Study Type Adult      Jaclynn Major, RPSGT  Reviewed by: Richelle Ito. [name] Glance, PhD, ABSM, FAASM Sleep Data: Lights Out: 10:18:50 PM Sleep Onset: 10:30:50 PM  Lights  On: 5:41:02 AM Sleep Efficiency: 83.4 %  Total Recording Time: 442.2 min Sleep Latency (from Lights Off) 12.0 min  Total Sleep Time (TST): 369.0 min R Latency (from Sleep Onset): 278.0 min  Sleep Period Time: 430.0 min Total number of awakenings: 9  Wake during sleep: 61.0 min Wake After Sleep Onset (WASO): 61.2 min   Sleep Data:         Arousal Summary: Stage  Latency from lights out (min) Latency from sleep onset (min) Duration (min) % Total Sleep Time  Normal values  N 1  12.0 0.0 14.5 3.9 (5%)  N 2 13.0 1.0 315.0 85.4 (50%)  N 3       0.0 0.0 (20%)  R 290.0 278.0 39.5 10.7 (25%)    Number Index  Spontaneous 9 1.5  Apneas & Hypopneas 0 0.0  RERAs 0 0.0       (Apneas & Hypopneas & RERAs)  (0) (0.0)  Limb Movement 0 0.0  Snore 0 0.0  TOTAL 9 1.5     Respiratory Data:  CA OA MA Apnea Hypopnea* A+ H RERA Total  Number 0 0 0 0 2 2 0 2  Mean Dur (sec) 0.0 0.0 0.0 0.0 26.0 26.0 0.0 26.0  Max Dur (sec) 0.0 0.0 0.0 0.0 26.5 26.5 0.0 26.5  Total Dur (min) 0.0 0.0 0.0 0.0 0.9 0.9 0.0 0.9  % of TST 0.0 0.0 0.0 0.0 0.2 0.2 0.0 0.2  Index (#/h TST) 0.0 0.0 0.0 0.0 0.3 0.3 0.0 0.3  *Hypopneas scored based on 4% or greater desaturation.  Sleep Stage:        REM NREM TST  AHI 0.0 0.4 0.3  RDI 0.0 0.4 0.3           Body Position Data:  Sleep (min) TST (%) REM (min) NREM (min) CA (#) OA (#) MA (#) HYP (#) AHI (#/h) RERA (#) RDI (#/h) Desat (#)  Supine 369.0 100.00 39.5 329.5 0 0 0 2 0.3 0 0.3 26  Non-Supine 0.00 0.00 0.00 0.00 0.00 0.00 0.00 0.00 0.00 0 0.00 0.00  Right: 0.0 0.00 0.0 0.0 0 0 0 0 0.0 0 0.00 0     Snoring: Total number of snoring episodes  0  Total time with snoring    min (   % of sleep)   Oximetry Distribution:             WK REM NREM TOTAL  Average (%)   95 94 94 94  < 90% 0.1 0.2 0.0 0.3  < 80% 0.0 0.0 0.0 0.0  < 70% 0.0 0.0 0.0 0.0  # of Desaturations* 2 3 22 27   Desat Index (#/hour) 2.1 4.6 4.0 4.4  Desat Max (%) 8 5 6 8   Desat Max Dur (sec) 117.0 57.0 92.0 117.0  Approx Min O2 during sleep 89  Approx min O2 during a respiratory event 90  Was Oxygen added (Y/N) and final rate No:   0 LPM  *Desaturations based on 3% or greater drop from baseline.   Cheyne Stokes Breathing: None Present   Heart Rate Summary:  Average Heart Rate During Sleep 49.7 bpm      Highest Heart Rate During Sleep (95th %) 53.0 bpm      Highest Heart Rate During Sleep 78 bpm      Highest Heart Rate During Recording (TIB) 89 bpm        Heart Rate Observations: Event Type # Events   Bradycardia 0 Lowest  HR Scored: N/A  Sinus Tachycardia During Sleep 0 Highest HR Scored: N/A  Narrow Complex Tachycardia 0 Highest HR Scored: N/A  Wide Complex Tachycardia 0 Highest HR Scored: N/A  Asystole 0 Longest Pause: N/A  Atrial Fibrillation 0 Duration Longest Event: N/A  Other Arrythmias  No Type:    Periodic Limb Movement Data: (Primary legs unless otherwise noted) Total # Limb Movement 0 Limb Movement Index 0.0  Total # PLMS    PLMS Index     Total # PLMS Arousals    PLMS Arousal Index     Percentage Sleep Time with PLMS   min (   % sleep)  Mean Duration limb movements (secs)

## 2022-10-04 NOTE — Progress Notes (Signed)
[facility] [address] [place], [place] [address]  Pulmonary Sleep Medicine   Office Visit Note  Patient Name: Jonathan [name] DOB: August 03, 1956 MRN [id number]    Chief Complaint: Obstructive Sleep Apnea visit  Brief History:  Damaris is seen today for a follow up visit for CPAP@ 10 cmH2O and to discuss the results of his most recent sleep study. The patient has a 2 year history of sleep apnea. Patient is not using PAP nightly.  The patient feels rested after sleeping with PAP.  The patient reports not benefiting from PAP use. Reported sleepiness is the same and the Epworth Sleepiness Score is 4 out of 24. The patient does not take naps. The patient complains of the following: none.  The compliance download shows 37% compliance with an average use time of 7 hours 25 minutes. The AHI is 1.9.  The patient does not complain of limb movements disrupting sleep. The patient had an updated sleep study while sleeping in a recliner and tested negative for OSA in this position. He does not wear his CPAP and states he doesn't notice any difference when sleeping with it or not and therefore would like to discontinue use given negative study. Advised he is only negative in recliner and should be retested if her were to go back to sleeping in a bed or if worsening symptoms arise. Patient given copy of study.  ROS  General: (-) fever, (-) chills, (-) night sweat Nose and Sinuses: (-) nasal stuffiness or itchiness, (-) postnasal drip, (-) nosebleeds, (-) sinus trouble. Mouth and Throat: (-) sore throat, (-) hoarseness. Neck: (-) swollen glands, (-) enlarged thyroid, (-) neck pain. Respiratory: - cough, - shortness of breath, - wheezing. Neurologic: - numbness, - tingling. Psychiatric: - anxiety, - depression   Current Medication: Outpatient Encounter Medications as of 10/09/2022  Medication Sig   apixaban (ELIQUIS) 5 MG TABS tablet take 1 tablet by oral route every 12 hours    gabapentin (NEURONTIN) 100 MG capsule Take by mouth.   acetaminophen (TYLENOL) 650 MG CR tablet Take by mouth.   allopurinol (ZYLOPRIM) 300 MG tablet Take 300 mg by mouth daily.   amLODipine (NORVASC) 10 MG tablet Take 20 mg by mouth daily.   atorvastatin (LIPITOR) 20 MG tablet Take 20 mg by mouth daily.   colchicine 0.6 MG tablet Take by mouth.   diclofenac Sodium (VOLTAREN) 1 % GEL 2 g every 6 (six) hours.   folic acid (FOLVITE) 1 MG tablet Take by mouth.   losartan (COZAAR) 50 MG tablet Take 1 tablet by mouth daily.   methotrexate (RHEUMATREX) 2.5 MG tablet Take by mouth.   metoprolol tartrate (LOPRESSOR) 25 MG tablet Take by mouth.   predniSONE (DELTASONE) 10 MG tablet Take by mouth.   sertraline (ZOLOFT) 25 MG tablet Take 25 mg by mouth daily.   [DISCONTINUED] losartan (COZAAR) 25 MG tablet Take 25 mg by mouth daily.   [DISCONTINUED] meloxicam (MOBIC) 15 MG tablet TAKE 1 TABLET BY MOUTH EVERY DAY WITH MEALS   No facility-administered encounter medications on file as of 10/09/2022.    Surgical History: Past Surgical History:  Procedure Laterality Date   CHONDROPLASTY Right 07/19/2019   Procedure: CHONDROPLASTY;  Surgeon: Jodi Geralds, MD;  Location: [facility];  Service: Orthopedics;  Laterality: Right;   KNEE ARTHROSCOPY WITH LATERAL MENISECTOMY Right 07/19/2019   Procedure: KNEE ARTHROSCOPY WITH LATERAL MENISECTOMY;  Surgeon: Jodi Geralds, MD;  Location: [facility];  Service: Orthopedics;  Laterality:  Right;   KNEE ARTHROSCOPY WITH MEDIAL MENISECTOMY Right 07/19/2019   Procedure: KNEE ARTHROSCOPY WITH MEDIAL MENISECTOMY;  Surgeon: Jodi Geralds, MD;  Location: [facility];  Service: Orthopedics;  Laterality: Right;   SKIN GRAFT     left arm    Medical History: Past Medical History:  Diagnosis Date   Gout    Hyperlipidemia    Hypertension    Knee pain, right    Pre-diabetes    Sleep apnea    uses CPAP nightly    Family  History: Non contributory to the present illness  Social History: Social History   Socioeconomic History   Marital status: Divorced    Spouse name: Not on file   Number of children: Not on file   Years of education: Not on file   Highest education level: Not on file  Occupational History   Not on file  Tobacco Use   Smoking status: Some Days    Types: Cigars   Smokeless tobacco: Never  Vaping Use   Vaping Use: Never used  Substance and Sexual Activity   Alcohol use: Yes    Comment: social   Drug use: No   Sexual activity: Not on file  Other Topics Concern   Not on file  Social History Narrative   Not on file   Social Determinants of Health   Financial Resource Strain: Not on file  Food Insecurity: Not on file  Transportation Needs: Not on file  Physical Activity: Not on file  Stress: Not on file  Social Connections: Not on file  Intimate Partner Violence: Not on file    Vital Signs: Blood pressure (!) 144/89, pulse 74, resp. rate 18, height 5\' 11"  (1.803 m), weight (!) 304 lb (137.9 kg), SpO2 94 %. Body mass index is 42.4 kg/m.    Examination: General Appearance: The patient is well-developed, well-nourished, and in no distress. Neck Circumference: 48 cm Skin: Gross inspection of skin unremarkable. Head: normocephalic, no gross deformities. Eyes: no gross deformities noted. ENT: ears appear grossly normal Neurologic: Alert and oriented. No involuntary movements.  STOP BANG RISK ASSESSMENT S (snore) Have you been told that you snore?     NO   T (tired) Are you often tired, fatigued, or sleepy during the day?   NO  O (obstruction) Do you stop breathing, choke, or gasp during sleep? YES   P (pressure) Do you have or are you being treated for high blood pressure? YES   B (BMI) Is your body index greater than 35 kg/m? YES   A (age) Are you 67 years old or older? YES   N (neck) Do you have a neck circumference greater than 16 inches?   YES   G  (gender) Are you a male? YES   TOTAL STOP/BANG "YES" ANSWERS 6       A STOP-Bang score of 2 or less is considered low risk, and a score of 5 or more is high risk for having either moderate or severe OSA. For people who score 3 or 4, doctors may need to perform further assessment to determine how likely they are to have OSA.         EPWORTH SLEEPINESS SCALE:  Scale:  (0)= no chance of dozing; (1)= slight chance of dozing; (2)= moderate chance of dozing; (3)= high chance of dozing  Chance  Situtation    Sitting and reading: 1    Watching TV: 1    Sitting Inactive in public: 0  As a passenger in car: 0      Lying down to rest: 1    Sitting and talking: 0    Sitting quielty after lunch: 1    In a car, stopped in traffic: 0   TOTAL SCORE:   4 out of 24    SLEEP STUDIES:  HST (04/2020) AHI 9/hr, min SpO2 80%, APAP@ 6-20 cmH2O due to AASM Covid 19 recommendation PSG (07/2022) Did not demonstrate significant OSA while sleeping on his back in recliner. If pt returns to sleeping in his bed, he may need to repeat PSG.   CPAP COMPLIANCE DATA:  Date Range: 10/09/2021-10/08/2022  Average Daily Use: 7 hours 25 minutes  Median Use: 7 hours 21 minutes  Compliance for > 4 Hours: 37%  AHI: 1.9 respiratory events per hour  Days Used: 140/365 days  Mask Leak: 37  95th Percentile Pressure: 10.6         LABS: No results found for this or any previous visit (from the past 2160 hour(s)).  Radiology: No results found.  No results found.  No results found.    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 Patient has not used machine in months and tested negative for OSA recently when sleeping in a recliner. Advised he should be retested if he goes back to sleeping in a bed in the future and explained that he could still have Osa in different position, but was negative when in the recliner and he plans to continue to sleep in this position and discontinue CPAP.   1. OSA (obstructive sleep apnea) Negative for OSA in recliner and therefore will discontinue use in this position. Advised to be retested if he switches back to sleeping in a bed in future or if worsening symptoms.  2. Essential hypertension Continue current medication and f/u with PCP.  3. Morbid obesity with BMI of 40.0-44.9, adult (HCC) Obesity Counseling: Had a lengthy discussion regarding patients BMI and weight issues. Patient was instructed on portion control as well as increased activity. Also discussed caloric restrictions with trying to maintain intake less than 2000 Kcal. Discussions were made in accordance with the 5As of weight management. Simple actions such as not eating late and if able to, taking a walk is suggested.    General Counseling: I have discussed the findings of the evaluation and examination with Colon.  I have also discussed any further diagnostic evaluation thatmay be needed or ordered today. Caydence verbalizes understanding of the findings of todays visit. We also reviewed his medications today and discussed drug interactions and side effects including but not limited excessive drowsiness and altered mental states. We also discussed that there is always a risk not just to him but also people around him. he has been encouraged to call the office with any questions or concerns that should arise related to todays visit.  No orders of the  defined types were placed in this encounter.  I have personally obtained a history, examined the patient, evaluated laboratory and imaging results, formulated the assessment and plan and placed orders.  This patient was seen by Drema Dallas, PA-C in collaboration with Dr. Devona Konig as a part of collaborative care agreement.  Allyne Gee, MD [facility] Diplomate ABMS Pulmonary Critical Care Medicine and Sleep Medicine

## 2022-10-09 ENCOUNTER — Ambulatory Visit (INDEPENDENT_AMBULATORY_CARE_PROVIDER_SITE_OTHER): Payer: Medicare HMO | Admitting: Internal Medicine

## 2022-10-09 VITALS — BP 144/89 | HR 74 | Resp 18 | Ht 71.0 in | Wt 304.0 lb

## 2022-10-09 DIAGNOSIS — Z6841 Body Mass Index (BMI) 40.0 and over, adult: Secondary | ICD-10-CM

## 2022-10-09 DIAGNOSIS — I10 Essential (primary) hypertension: Secondary | ICD-10-CM | POA: Diagnosis not present

## 2022-10-09 DIAGNOSIS — G4733 Obstructive sleep apnea (adult) (pediatric): Secondary | ICD-10-CM | POA: Diagnosis not present
